# Patient Record
Sex: Male | Born: 1995 | Race: White | Hispanic: No | Marital: Single | Smoking: Current every day smoker
Health system: Southern US, Community
[De-identification: ages and names within clinical notes are randomized; demographics above are authoritative.]

## PROBLEM LIST (undated history)

## (undated) DIAGNOSIS — R748 Abnormal levels of other serum enzymes: Secondary | ICD-10-CM

## (undated) HISTORY — PX: LUMBAR DISC SURGERY: SHX700

---
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
## 2019-12-25 ENCOUNTER — Emergency Department (HOSPITAL_COMMUNITY): Payer: Self-pay

## 2019-12-25 ENCOUNTER — Encounter (HOSPITAL_COMMUNITY): Payer: Self-pay

## 2019-12-25 ENCOUNTER — Other Ambulatory Visit: Payer: Self-pay

## 2019-12-25 ENCOUNTER — Emergency Department (HOSPITAL_COMMUNITY)
Admission: EM | Admit: 2019-12-25 | Discharge: 2019-12-25 | Disposition: A | Discharge status: Home / Self Care | Payer: Self-pay | Attending: Emergency Medicine | Admitting: Emergency Medicine

## 2019-12-25 DIAGNOSIS — Y929 Unspecified place or not applicable: Secondary | ICD-10-CM | POA: Insufficient documentation

## 2019-12-25 DIAGNOSIS — W25XXXA Contact with sharp glass, initial encounter: Secondary | ICD-10-CM | POA: Insufficient documentation

## 2019-12-25 DIAGNOSIS — Y999 Unspecified external cause status: Secondary | ICD-10-CM | POA: Insufficient documentation

## 2019-12-25 DIAGNOSIS — Z23 Encounter for immunization: Secondary | ICD-10-CM | POA: Insufficient documentation

## 2019-12-25 DIAGNOSIS — Y939 Activity, unspecified: Secondary | ICD-10-CM | POA: Insufficient documentation

## 2019-12-25 DIAGNOSIS — S61412A Laceration without foreign body of left hand, initial encounter: Secondary | ICD-10-CM | POA: Insufficient documentation

## 2019-12-25 MED ORDER — LIDOCAINE-EPINEPHRINE 2 %-1:100000 IJ SOLN
10.0000 mL | Freq: Once | INTRAMUSCULAR | Status: AC
  Administered 2019-12-25: 10 mL
  Filled 2019-12-25: qty 1

## 2019-12-25 MED ORDER — TETANUS-DIPHTH-ACELL PERTUSSIS 5-2.5-18.5 LF-MCG/0.5 IM SUSP
0.5000 mL | Freq: Once | INTRAMUSCULAR | Status: AC
  Administered 2019-12-25: 04:00:00 0.5 mL via INTRAMUSCULAR
  Filled 2019-12-25: qty 0.5
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
## 2019-12-25 NOTE — Discharge Instructions (Signed)

## 2022-03-13 ENCOUNTER — Encounter (HOSPITAL_BASED_OUTPATIENT_CLINIC_OR_DEPARTMENT_OTHER): Payer: Self-pay | Admitting: Emergency Medicine

## 2022-03-13 ENCOUNTER — Other Ambulatory Visit: Payer: Self-pay

## 2022-03-13 ENCOUNTER — Emergency Department (HOSPITAL_BASED_OUTPATIENT_CLINIC_OR_DEPARTMENT_OTHER)
Admission: EM | Admit: 2022-03-13 | Discharge: 2022-03-13 | Disposition: A | Discharge status: Home / Self Care | Payer: BC Managed Care – PPO | Attending: Emergency Medicine | Admitting: Emergency Medicine

## 2022-03-13 ENCOUNTER — Emergency Department (HOSPITAL_BASED_OUTPATIENT_CLINIC_OR_DEPARTMENT_OTHER): Payer: BC Managed Care – PPO

## 2022-03-13 DIAGNOSIS — R110 Nausea: Secondary | ICD-10-CM | POA: Diagnosis not present

## 2022-03-13 DIAGNOSIS — R109 Unspecified abdominal pain: Secondary | ICD-10-CM | POA: Diagnosis not present

## 2022-03-13 DIAGNOSIS — K920 Hematemesis: Secondary | ICD-10-CM | POA: Diagnosis not present

## 2022-03-13 DIAGNOSIS — Y906 Blood alcohol level of 120-199 mg/100 ml: Secondary | ICD-10-CM | POA: Insufficient documentation

## 2022-03-13 DIAGNOSIS — R1013 Epigastric pain: Secondary | ICD-10-CM | POA: Insufficient documentation

## 2022-03-13 DIAGNOSIS — F1020 Alcohol dependence, uncomplicated: Secondary | ICD-10-CM | POA: Insufficient documentation

## 2022-03-13 DIAGNOSIS — R790 Abnormal level of blood mineral: Secondary | ICD-10-CM | POA: Diagnosis not present

## 2022-03-13 DIAGNOSIS — F1090 Alcohol use, unspecified, uncomplicated: Secondary | ICD-10-CM

## 2022-03-13 DIAGNOSIS — R079 Chest pain, unspecified: Secondary | ICD-10-CM | POA: Diagnosis not present

## 2022-03-13 DIAGNOSIS — R251 Tremor, unspecified: Secondary | ICD-10-CM | POA: Diagnosis not present

## 2022-03-13 DIAGNOSIS — R112 Nausea with vomiting, unspecified: Secondary | ICD-10-CM | POA: Diagnosis not present

## 2022-03-13 LAB — CBC WITH DIFFERENTIAL/PLATELET
Abs Immature Granulocytes: 0.01 10*3/uL (ref 0.00–0.07)
Basophils Absolute: 0.2 10*3/uL — ABNORMAL HIGH (ref 0.0–0.1)
Basophils Relative: 2 %
Eosinophils Absolute: 0.1 10*3/uL (ref 0.0–0.5)
Eosinophils Relative: 2 %
HCT: 40.8 % (ref 39.0–52.0)
Hemoglobin: 14.3 g/dL (ref 13.0–17.0)
Immature Granulocytes: 0 %
Lymphocytes Relative: 35 %
Lymphs Abs: 3 10*3/uL (ref 0.7–4.0)
MCH: 35 pg — ABNORMAL HIGH (ref 26.0–34.0)
MCHC: 35 g/dL (ref 30.0–36.0)
MCV: 99.8 fL (ref 80.0–100.0)
Monocytes Absolute: 1.1 10*3/uL — ABNORMAL HIGH (ref 0.1–1.0)
Monocytes Relative: 13 %
Neutro Abs: 4.1 10*3/uL (ref 1.7–7.7)
Neutrophils Relative %: 48 %
Platelets: 179 10*3/uL (ref 150–400)
RBC: 4.09 MIL/uL — ABNORMAL LOW (ref 4.22–5.81)
RDW: 12.1 % (ref 11.5–15.5)
WBC: 8.6 10*3/uL (ref 4.0–10.5)
nRBC: 0 % (ref 0.0–0.2)

## 2022-03-13 LAB — HEPATIC FUNCTION PANEL
ALT: 220 U/L — ABNORMAL HIGH (ref 0–44)
AST: 407 U/L — ABNORMAL HIGH (ref 15–41)
Albumin: 4.1 g/dL (ref 3.5–5.0)
Alkaline Phosphatase: 107 U/L (ref 38–126)
Bilirubin, Direct: 0.5 mg/dL — ABNORMAL HIGH (ref 0.0–0.2)
Indirect Bilirubin: 1.4 mg/dL — ABNORMAL HIGH (ref 0.3–0.9)
Total Bilirubin: 1.9 mg/dL — ABNORMAL HIGH (ref 0.3–1.2)
Total Protein: 7.2 g/dL (ref 6.5–8.1)

## 2022-03-13 LAB — PROTIME-INR
INR: 1 (ref 0.8–1.2)
Prothrombin Time: 13.4 seconds (ref 11.4–15.2)

## 2022-03-13 LAB — BASIC METABOLIC PANEL
Anion gap: 16 — ABNORMAL HIGH (ref 5–15)
BUN: 6 mg/dL (ref 6–20)
CO2: 23 mmol/L (ref 22–32)
Calcium: 9.4 mg/dL (ref 8.9–10.3)
Chloride: 97 mmol/L — ABNORMAL LOW (ref 98–111)
Creatinine, Ser: 0.72 mg/dL (ref 0.61–1.24)
GFR, Estimated: >60 mL/min (ref >60)
Glucose, Bld: 99 mg/dL (ref 70–99)
Potassium: 3.6 mmol/L (ref 3.5–5.1)
Sodium: 136 mmol/L (ref 135–145)

## 2022-03-13 LAB — TROPONIN I (HIGH SENSITIVITY)
Troponin I (High Sensitivity): 3 ng/L (ref <18)
Troponin I (High Sensitivity): 2 ng/L (ref <18)

## 2022-03-13 LAB — ETHANOL: Alcohol, Ethyl (B): 148 mg/dL — ABNORMAL HIGH (ref <10)

## 2022-03-13 MED ORDER — METOCLOPRAMIDE HCL 5 MG/ML IJ SOLN
10.0000 mg | Freq: Once | INTRAMUSCULAR | Status: AC
  Administered 2022-03-13: 10 mg via INTRAVENOUS
  Filled 2022-03-13: qty 2

## 2022-03-13 MED ORDER — FAMOTIDINE 20 MG PO TABS: 40.0000 mg | ORAL_TABLET | Freq: Every day | ORAL | 0 refills | Status: DC

## 2022-03-13 MED ORDER — DIPHENHYDRAMINE HCL 25 MG PO CAPS
25.0000 mg | ORAL_CAPSULE | Freq: Once | ORAL | Status: AC
  Administered 2022-03-13: 25 mg via ORAL
  Filled 2022-03-13: qty 1

## 2022-03-13 MED ORDER — PANTOPRAZOLE SODIUM 40 MG IV SOLR
40.0000 mg | Freq: Once | INTRAVENOUS | Status: AC
  Administered 2022-03-13: 40 mg via INTRAVENOUS
  Filled 2022-03-13: qty 10

## 2022-03-13 MED ORDER — VITAMIN B-12 100 MCG PO TABS: 100.0000 ug | ORAL_TABLET | Freq: Every day | ORAL | 0 refills | Status: DC

## 2022-03-13 MED ORDER — LIDOCAINE VISCOUS HCL 2 % MT SOLN
15.0000 mL | Freq: Once | OROMUCOSAL | Status: AC
  Administered 2022-03-13: 15 mL via ORAL
  Filled 2022-03-13: qty 15

## 2022-03-13 MED ORDER — ALUM & MAG HYDROXIDE-SIMETH 200-200-20 MG/5ML PO SUSP
30.0000 mL | Freq: Once | ORAL | Status: AC
  Administered 2022-03-13: 30 mL via ORAL
  Filled 2022-03-13: qty 30

## 2022-03-13 MED ORDER — ONDANSETRON 4 MG PO TBDP: 4.0000 mg | ORAL_TABLET | Freq: Three times a day (TID) | ORAL | 0 refills | Status: DC | PRN

## 2022-03-13 MED ORDER — LORAZEPAM 2 MG/ML IJ SOLN
1.0000 mg | Freq: Once | INTRAMUSCULAR | Status: AC
  Administered 2022-03-13: 1 mg via INTRAVENOUS
  Filled 2022-03-13: qty 1

## 2022-03-13 MED ORDER — FOLIC ACID 1 MG PO TABS: 1.0000 mg | ORAL_TABLET | Freq: Every day | ORAL | 0 refills | Status: DC

## 2022-03-13 NOTE — ED Provider Notes (Signed)
?[facility] EMERGENCY DEPARTMENT ?Provider Note ? ? ?CSN: [id number] ?Arrival date & time: 03/13/22  1554 ? ?  ? ?History ? ?Chief Complaint  ?Patient presents with  ? Hematemesis  ? Abdominal Pain  ? ? ?Thomas Wyatt is a 26 y.o. male. ? ? ?Chest Pain ? ?Patient is a 26 year old male presented emergency room today with complaints of ?Yesterday around he states he has had some epigastric abdominal pain and states that after vomiting numerous times he has had some blood in his vomit.  He states that it is bright red blood.  Denies any dark or tarry looking poop.  He states he is always somewhat tremulous but feels that he is more shaky today.  He does not have any difficulty breathing denies any hemoptysis no lightheadedness or dizziness.  Denies any bowel or bladder incontinence or back pain. ? ?He states he is able to walk without difficulty. ? ? ?Patient drinks vodka daily he states he sips vodka.  Seems that he is drinking Smirnov ice but is drinking large quantities of it.  Is a very difficult time describing to me what kind of bottle he is drinking is out of but it seems to be one of the taller bowels not simply a 12 ounce bottle. ? ?He states his last drink of alcohol was earlier today.  No other associate symptoms. ?  ? ?Home Medications ?Prior to Admission medications   ?Medication Sig Start Date End Date Taking? Authorizing Provider  ?famotidine (PEPCID) 20 MG tablet Take 2 tablets (40 mg total) by mouth daily. 03/13/22  Yes Gailen Shelter, PA  ?ondansetron (ZOFRAN-ODT) 4 MG disintegrating tablet Take 1 tablet (4 mg total) by mouth every 8 (eight) hours as needed for nausea or vomiting. 03/13/22  Yes Gailen Shelter, PA  ?   ? ?Allergies    ?Patient has no known allergies.   ? ?Review of Systems   ?Review of Systems  ?Cardiovascular:  Positive for chest pain.  ? ?Physical Exam ?Updated Vital Signs ?BP 127/90   Pulse 89   Temp 99 ?F (37.2 ?C)   Resp 19   Ht 5\' 7"  (1.702 m)   Wt 68 kg   SpO2  97%   BMI 23.49 kg/m?  ?Physical Exam ?Vitals and nursing note reviewed.  ?Constitutional:   ?   General: He is not in acute distress. ?   Comments: Chronically ill-appearing 26 year old male.  Somewhat tremulous  ?HENT:  ?   Head: Normocephalic and atraumatic.  ?   Nose: Nose normal.  ?   Mouth/Throat:  ?   Mouth: Mucous membranes are moist.  ?Eyes:  ?   General: No scleral icterus. ?Cardiovascular:  ?   Rate and Rhythm: Normal rate and regular rhythm.  ?   Pulses: Normal pulses.  ?   Heart sounds: Normal heart sounds.  ?Pulmonary:  ?   Effort: Pulmonary effort is normal. No respiratory distress.  ?   Breath sounds: Normal breath sounds. No wheezing.  ?Abdominal:  ?   Palpations: Abdomen is soft.  ?   Tenderness: There is no abdominal tenderness. There is no guarding or rebound.  ?Musculoskeletal:  ?   Cervical back: Normal range of motion.  ?   Right lower leg: No edema.  ?   Left lower leg: No edema.  ?Skin: ?   General: Skin is warm and dry.  ?   Capillary Refill: Capillary refill takes less than 2 seconds.  ?Neurological:  ?  Mental Status: He is alert. Mental status is at baseline.  ?Psychiatric:     ?   Mood and Affect: Mood normal.     ?   Behavior: Behavior normal.  ? ? ?ED Results / Procedures / Treatments   ?Labs ?(all labs ordered are listed, but only abnormal results are displayed) ?Labs Reviewed  ?BASIC METABOLIC PANEL - Abnormal; Notable for the following components:  ?    Result Value  ? Chloride 97 (*)   ? Anion gap 16 (*)   ? All other components within normal limits  ?CBC WITH DIFFERENTIAL/PLATELET - Abnormal; Notable for the following components:  ? RBC 4.09 (*)   ? MCH 35.0 (*)   ? Monocytes Absolute 1.1 (*)   ? Basophils Absolute 0.2 (*)   ? All other components within normal limits  ?ETHANOL - Abnormal; Notable for the following components:  ? Alcohol, Ethyl (B) 148 (*)   ? All other components within normal limits  ?HEPATIC FUNCTION PANEL - Abnormal; Notable for the following components:  ?  AST 407 (*)   ? ALT 220 (*)   ? Total Bilirubin 1.9 (*)   ? Bilirubin, Direct 0.5 (*)   ? Indirect Bilirubin 1.4 (*)   ? All other components within normal limits  ?PROTIME-INR  ?TROPONIN I (HIGH SENSITIVITY)  ?TROPONIN I (HIGH SENSITIVITY)  ? ? ?EKG ?None ? ?Radiology ?DG Chest 2 View ? ?Result Date: 03/13/2022 ?CLINICAL DATA:  Left-sided chest and abdominal pain beginning yesterday. Hematemesis. EXAM: CHEST - 2 VIEW COMPARISON:  None Available. FINDINGS: The heart size and mediastinal contours are within normal limits. Both lungs are clear. The visualized skeletal structures are unremarkable. IMPRESSION: No active cardiopulmonary disease. Electronically Signed   By: Danae Orleans M.D.   On: 03/13/2022 16:35   ? ?Procedures ?Procedures  ? ? ?Medications Ordered in ED ?Medications  ?LORazepam (ATIVAN) injection 1 mg (1 mg Intravenous Given 03/13/22 1819)  ?metoCLOPramide (REGLAN) injection 10 mg (10 mg Intravenous Given 03/13/22 1821)  ?diphenhydrAMINE (BENADRYL) capsule 25 mg (25 mg Oral Given 03/13/22 1810)  ?pantoprazole (PROTONIX) injection 40 mg (40 mg Intravenous Given 03/13/22 1828)  ?alum & mag hydroxide-simeth (MAALOX/MYLANTA) 200-200-20 MG/5ML suspension 30 mL (30 mLs Oral Given 03/13/22 1917)  ?  And  ?lidocaine (XYLOCAINE) 2 % viscous mouth solution 15 mL (15 mLs Oral Given 03/13/22 1917)  ? ? ?ED Course/ Medical Decision Making/ A&P ?  ?                        ?Medical Decision Making ?Amount and/or Complexity of Data Reviewed ?Labs: ordered. ?Radiology: ordered. ? ?Risk ?OTC drugs. ?Prescription drug management. ? ? ?Patient is a 26 year old male presented emergency room today with complaints of ?Yesterday around he states he has had some epigastric abdominal pain and states that after vomiting numerous times he has had some blood in his vomit.  He states that it is bright red blood.  Denies any dark or tarry looking poop.  He states he is always somewhat tremulous but feels that he is more shaky today.  He  does not have any difficulty breathing denies any hemoptysis no lightheadedness or dizziness.  Denies any bowel or bladder incontinence or back pain. ? ?He states he is able to walk without difficulty. ? ? ?Patient drinks vodka daily he states he sips vodka.  Seems that he is drinking Smirnov ice but is drinking large quantities of it.  Is a very  difficult time describing to me what kind of bottle he is drinking is out of but it seems to be one of the taller bowels not simply a 12 ounce bottle. ? ?He states his last drink of alcohol was earlier today.  No other associate symptoms. ? ? ? ?Patient is a heavy alcohol drinker.  His ethanol is elevated 148 today.  Transaminitis consistent with alcohol use.  BNP mild elevation anion gap likely alcoholic ketosis.  CBC unremarkable.  Platelets within normal limits INR within normal limits troponin x2 flat not remarkable.  EKG without evidence of acute ischemia.  Chest x-ray unremarkable I personally viewed all labs images and EKGs. ? ?After GI cocktail, Reglan Benadryl and 1 mg of Ativan and 1 dose of Protonix patient is asymptomatic.  Tolerating p.o. ? ?He is quite drowsy after the Ativan. ? ?He was monitored for another additional hour p.o. challenged and will be discharged home.  Recommend alcohol cessation he states that he will "taper down "resources provided ? ?Final Clinical Impression(s) / ED Diagnoses ?Final diagnoses:  ?Epigastric abdominal pain  ?Hematemesis with nausea  ?Alcohol use disorder  ? ? ?Rx / DC Orders ?ED Discharge Orders   ? ?      Ordered  ?  famotidine (PEPCID) 20 MG tablet  Daily       ? 03/13/22 2003  ?  ondansetron (ZOFRAN-ODT) 4 MG disintegrating tablet  Every 8 hours PRN       ? 03/13/22 2004  ? ?  ?  ? ?  ? ? ?  ?Gailen ShelterFondaw, Aela Bohan S, [place]PA ?03/13/22 2011 ? ?  ?Vanetta MuldersZackowski, Scott, MD ?03/14/22 1659 ? ?

## 2022-03-13 NOTE — ED Triage Notes (Addendum)
Hematemesis last night. Cp and left sided abdominal pain x 2 - 3 hours. Sitting up straight worsens pain. Denies shob.  ?

## 2022-03-13 NOTE — ED Notes (Signed)
Pt states he began having nausea and vomiting blood at 1600 yesterday.  CP started at approx 1300 today.  Pt states he has had 2 episodes of vomiting blood in 24 hrs.   ?Pt is cold and trembling.  IV started and meds given. Girlfriend at bedside ?

## 2022-03-13 NOTE — Discharge Instructions (Addendum)
I recommend that you cut down dramatically on your alcohol use.  As we discussed, if you were willing to stop using alcohol I would be willing to prescribe you medicine to help you with this ? ?I have given you the information for outpatient counseling and therapy as well as substance use clinics ? ?You may always return the emergency room for any new or concerning symptoms.  Use Pepcid every day as prescribed.  I also prescribed you Zofran which is a nausea medicine you can use as needed for nausea. ?

## 2023-03-08 ENCOUNTER — Other Ambulatory Visit: Payer: Self-pay

## 2023-03-08 ENCOUNTER — Encounter (HOSPITAL_BASED_OUTPATIENT_CLINIC_OR_DEPARTMENT_OTHER): Payer: Self-pay

## 2023-03-08 ENCOUNTER — Emergency Department (HOSPITAL_BASED_OUTPATIENT_CLINIC_OR_DEPARTMENT_OTHER): Payer: BC Managed Care – PPO

## 2023-03-08 ENCOUNTER — Emergency Department (HOSPITAL_BASED_OUTPATIENT_CLINIC_OR_DEPARTMENT_OTHER)
Admission: EM | Admit: 2023-03-08 | Discharge: 2023-03-08 | Disposition: A | Discharge status: Home / Self Care | Payer: BC Managed Care – PPO | Attending: Emergency Medicine | Admitting: Emergency Medicine

## 2023-03-08 DIAGNOSIS — M79605 Pain in left leg: Secondary | ICD-10-CM | POA: Diagnosis not present

## 2023-03-08 DIAGNOSIS — M5442 Lumbago with sciatica, left side: Secondary | ICD-10-CM | POA: Insufficient documentation

## 2023-03-08 DIAGNOSIS — M549 Dorsalgia, unspecified: Secondary | ICD-10-CM | POA: Diagnosis not present

## 2023-03-08 MED ORDER — KETOROLAC TROMETHAMINE 30 MG/ML IJ SOLN
30.0000 mg | Freq: Once | INTRAMUSCULAR | Status: AC
  Administered 2023-03-08: 30 mg via INTRAMUSCULAR
  Filled 2023-03-08: qty 1

## 2023-03-08 MED ORDER — CYCLOBENZAPRINE HCL 5 MG PO TABS
5.0000 mg | ORAL_TABLET | Freq: Once | ORAL | Status: AC
  Administered 2023-03-08: 5 mg via ORAL
  Filled 2023-03-08: qty 1

## 2023-03-08 MED ORDER — NAPROXEN 500 MG PO TABS: 500.0000 mg | ORAL_TABLET | Freq: Two times a day (BID) | ORAL | 0 refills | Status: DC

## 2023-03-08 MED ORDER — LIDOCAINE 5 % EX PTCH
1.0000 | MEDICATED_PATCH | CUTANEOUS | Status: DC
  Administered 2023-03-08: 1 via TRANSDERMAL
  Filled 2023-03-08: qty 1

## 2023-03-08 MED ORDER — CYCLOBENZAPRINE HCL 5 MG PO TABS: 5.0000 mg | ORAL_TABLET | Freq: Two times a day (BID) | ORAL | 0 refills | Status: DC | PRN
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
## 2023-03-08 NOTE — Discharge Instructions (Addendum)
Please follow-up with your primary care doctor, return to the ER if you have any changes in the color of your leg, weakness, numbness.  I have provided you with exercises, to help with your sciatica.  Do not take the naproxen with other anti-inflammatories.  Be careful with the Flexeril as this may cause increased fatigue.

## 2023-03-08 NOTE — ED Triage Notes (Addendum)
L back, hip, and leg pain x 1 year but getting worse x2 weeks. No injury reported.

## 2023-03-12 DIAGNOSIS — M5442 Lumbago with sciatica, left side: Secondary | ICD-10-CM | POA: Diagnosis not present

## 2023-03-19 ENCOUNTER — Other Ambulatory Visit (HOSPITAL_COMMUNITY): Payer: Self-pay | Admitting: Family Medicine

## 2023-03-19 DIAGNOSIS — M5442 Lumbago with sciatica, left side: Secondary | ICD-10-CM

## 2023-03-19 DIAGNOSIS — R300 Dysuria: Secondary | ICD-10-CM | POA: Diagnosis not present

## 2023-03-20 ENCOUNTER — Ambulatory Visit (HOSPITAL_COMMUNITY)
Admission: RE | Admit: 2023-03-20 | Discharge: 2023-03-20 | Disposition: A | Discharge status: Home / Self Care | Payer: BC Managed Care – PPO | Source: Ambulatory Visit | Attending: Family Medicine | Admitting: Family Medicine

## 2023-03-20 DIAGNOSIS — M5442 Lumbago with sciatica, left side: Secondary | ICD-10-CM | POA: Insufficient documentation

## 2023-03-20 MED ORDER — GADOBUTROL 1 MMOL/ML IV SOLN
6.0000 mL | Freq: Once | INTRAVENOUS | Status: AC | PRN
  Administered 2023-03-20: 6 mL via INTRAVENOUS

## 2023-03-28 DIAGNOSIS — M5126 Other intervertebral disc displacement, lumbar region: Secondary | ICD-10-CM | POA: Diagnosis not present

## 2023-04-17 DIAGNOSIS — R339 Retention of urine, unspecified: Secondary | ICD-10-CM | POA: Diagnosis not present

## 2023-04-17 DIAGNOSIS — M5127 Other intervertebral disc displacement, lumbosacral region: Secondary | ICD-10-CM | POA: Diagnosis not present

## 2023-04-17 DIAGNOSIS — R300 Dysuria: Secondary | ICD-10-CM | POA: Diagnosis not present

## 2023-04-18 DIAGNOSIS — R300 Dysuria: Secondary | ICD-10-CM | POA: Diagnosis not present

## 2023-04-26 DIAGNOSIS — M5126 Other intervertebral disc displacement, lumbar region: Secondary | ICD-10-CM | POA: Diagnosis not present

## 2023-05-01 DIAGNOSIS — M5126 Other intervertebral disc displacement, lumbar region: Secondary | ICD-10-CM | POA: Diagnosis not present

## 2023-05-07 DIAGNOSIS — M5416 Radiculopathy, lumbar region: Secondary | ICD-10-CM | POA: Diagnosis not present

## 2023-05-08 DIAGNOSIS — M5126 Other intervertebral disc displacement, lumbar region: Secondary | ICD-10-CM | POA: Diagnosis not present

## 2023-05-21 DIAGNOSIS — M5126 Other intervertebral disc displacement, lumbar region: Secondary | ICD-10-CM | POA: Diagnosis not present

## 2023-05-21 IMAGING — DX DG CHEST 2V
2 series · 2 of 2 positions shown · non-contrast
Comparison: None Available.

CLINICAL DATA: Left-sided chest and abdominal pain beginning
yesterday. Hematemesis.

EXAM:
CHEST - 2 VIEW

[chest pa]
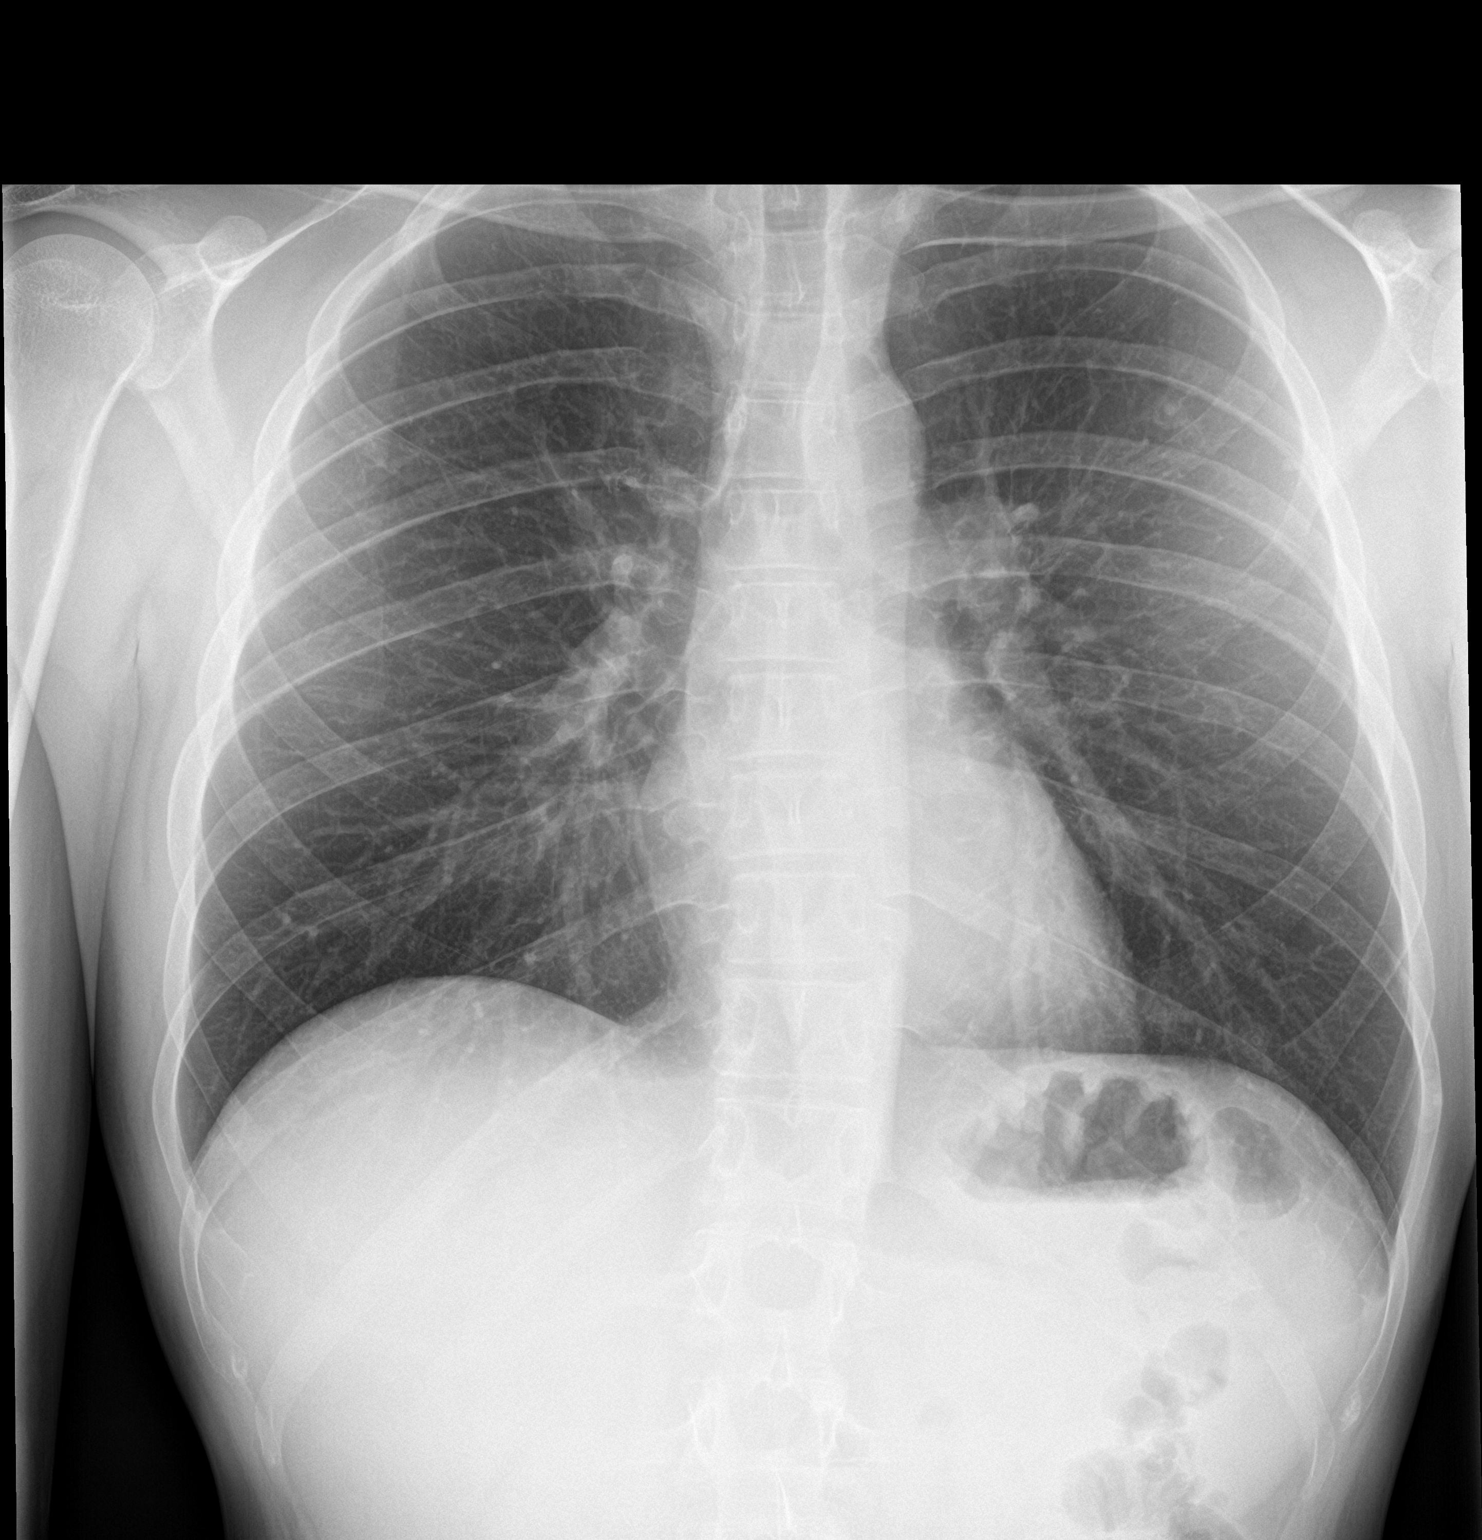

[chest lat]
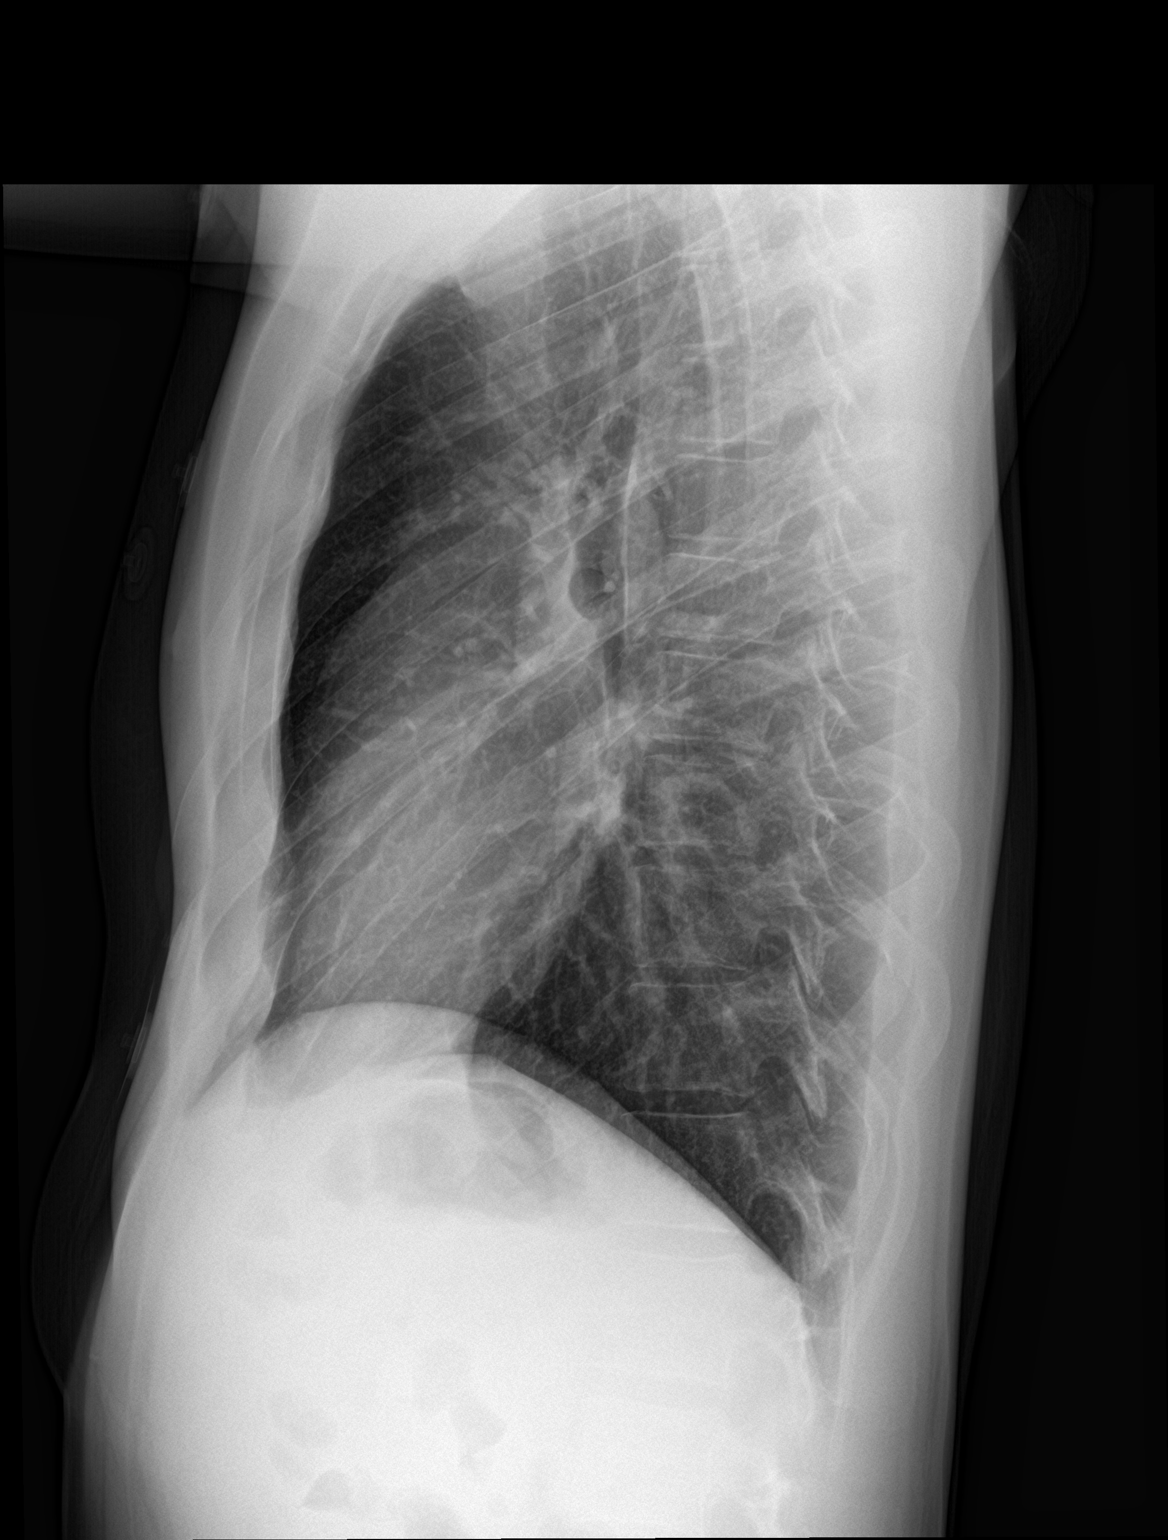

[2 of 2 positions shown; findings below may reference images not displayed]

FINDINGS: The heart size and mediastinal contours are within normal limits.
Both lungs are clear. The visualized skeletal structures are
unremarkable.
IMPRESSION: No active cardiopulmonary disease.

## 2023-05-23 DIAGNOSIS — M5126 Other intervertebral disc displacement, lumbar region: Secondary | ICD-10-CM | POA: Diagnosis not present

## 2023-06-03 DIAGNOSIS — M5127 Other intervertebral disc displacement, lumbosacral region: Secondary | ICD-10-CM | POA: Diagnosis not present

## 2023-06-03 DIAGNOSIS — M5126 Other intervertebral disc displacement, lumbar region: Secondary | ICD-10-CM | POA: Diagnosis not present

## 2023-06-03 DIAGNOSIS — M5117 Intervertebral disc disorders with radiculopathy, lumbosacral region: Secondary | ICD-10-CM | POA: Diagnosis not present

## 2023-08-29 DIAGNOSIS — R17 Unspecified jaundice: Secondary | ICD-10-CM | POA: Diagnosis not present

## 2023-09-02 DIAGNOSIS — R748 Abnormal levels of other serum enzymes: Secondary | ICD-10-CM | POA: Diagnosis not present

## 2023-09-03 ENCOUNTER — Other Ambulatory Visit (HOSPITAL_BASED_OUTPATIENT_CLINIC_OR_DEPARTMENT_OTHER): Payer: Self-pay | Admitting: Family Medicine

## 2023-09-03 ENCOUNTER — Encounter (HOSPITAL_BASED_OUTPATIENT_CLINIC_OR_DEPARTMENT_OTHER): Payer: Self-pay | Admitting: Family Medicine

## 2023-09-03 DIAGNOSIS — R7989 Other specified abnormal findings of blood chemistry: Secondary | ICD-10-CM

## 2023-09-09 ENCOUNTER — Emergency Department (HOSPITAL_COMMUNITY): Payer: BC Managed Care – PPO

## 2023-09-09 ENCOUNTER — Inpatient Hospital Stay (HOSPITAL_COMMUNITY)
Admission: EM | Admit: 2023-09-09 | Discharge: 2023-09-13 | DRG: 433 | Disposition: A | Discharge status: Home / Self Care | Payer: BC Managed Care – PPO | Attending: Student | Admitting: Student

## 2023-09-09 DIAGNOSIS — K5730 Diverticulosis of large intestine without perforation or abscess without bleeding: Secondary | ICD-10-CM | POA: Diagnosis present

## 2023-09-09 DIAGNOSIS — E871 Hypo-osmolality and hyponatremia: Secondary | ICD-10-CM | POA: Diagnosis present

## 2023-09-09 DIAGNOSIS — K7010 Alcoholic hepatitis without ascites: Principal | ICD-10-CM | POA: Diagnosis present

## 2023-09-09 DIAGNOSIS — I8510 Secondary esophageal varices without bleeding: Secondary | ICD-10-CM | POA: Diagnosis present

## 2023-09-09 DIAGNOSIS — F1021 Alcohol dependence, in remission: Secondary | ICD-10-CM

## 2023-09-09 DIAGNOSIS — E876 Hypokalemia: Secondary | ICD-10-CM | POA: Diagnosis present

## 2023-09-09 DIAGNOSIS — R17 Unspecified jaundice: Secondary | ICD-10-CM | POA: Diagnosis not present

## 2023-09-09 DIAGNOSIS — K219 Gastro-esophageal reflux disease without esophagitis: Secondary | ICD-10-CM | POA: Diagnosis present

## 2023-09-09 DIAGNOSIS — R162 Hepatomegaly with splenomegaly, not elsewhere classified: Secondary | ICD-10-CM | POA: Diagnosis not present

## 2023-09-09 DIAGNOSIS — R932 Abnormal findings on diagnostic imaging of liver and biliary tract: Secondary | ICD-10-CM | POA: Diagnosis not present

## 2023-09-09 DIAGNOSIS — K7030 Alcoholic cirrhosis of liver without ascites: Secondary | ICD-10-CM | POA: Diagnosis present

## 2023-09-09 DIAGNOSIS — K7469 Other cirrhosis of liver: Secondary | ICD-10-CM | POA: Diagnosis not present

## 2023-09-09 DIAGNOSIS — K766 Portal hypertension: Secondary | ICD-10-CM | POA: Diagnosis not present

## 2023-09-09 DIAGNOSIS — I8500 Esophageal varices without bleeding: Secondary | ICD-10-CM | POA: Insufficient documentation

## 2023-09-09 DIAGNOSIS — F17200 Nicotine dependence, unspecified, uncomplicated: Secondary | ICD-10-CM | POA: Insufficient documentation

## 2023-09-09 DIAGNOSIS — R7401 Elevation of levels of liver transaminase levels: Principal | ICD-10-CM

## 2023-09-09 DIAGNOSIS — R9431 Abnormal electrocardiogram [ECG] [EKG]: Secondary | ICD-10-CM | POA: Diagnosis not present

## 2023-09-09 DIAGNOSIS — Z91410 Personal history of adult physical and sexual abuse: Secondary | ICD-10-CM

## 2023-09-09 DIAGNOSIS — D689 Coagulation defect, unspecified: Secondary | ICD-10-CM | POA: Diagnosis present

## 2023-09-09 DIAGNOSIS — E875 Hyperkalemia: Secondary | ICD-10-CM | POA: Diagnosis present

## 2023-09-09 DIAGNOSIS — K760 Fatty (change of) liver, not elsewhere classified: Secondary | ICD-10-CM | POA: Diagnosis present

## 2023-09-09 DIAGNOSIS — D72829 Elevated white blood cell count, unspecified: Secondary | ICD-10-CM | POA: Diagnosis present

## 2023-09-09 DIAGNOSIS — F17210 Nicotine dependence, cigarettes, uncomplicated: Secondary | ICD-10-CM | POA: Diagnosis present

## 2023-09-09 DIAGNOSIS — R531 Weakness: Secondary | ICD-10-CM | POA: Diagnosis not present

## 2023-09-09 DIAGNOSIS — F1010 Alcohol abuse, uncomplicated: Secondary | ICD-10-CM | POA: Diagnosis present

## 2023-09-09 DIAGNOSIS — R748 Abnormal levels of other serum enzymes: Secondary | ICD-10-CM | POA: Diagnosis not present

## 2023-09-09 LAB — CBC WITH DIFFERENTIAL/PLATELET
Abs Immature Granulocytes: 0.26 10*3/uL — ABNORMAL HIGH (ref 0.00–0.07)
Basophils Absolute: 0.1 10*3/uL (ref 0.0–0.1)
Basophils Relative: 1 %
Eosinophils Absolute: 0.2 10*3/uL (ref 0.0–0.5)
Eosinophils Relative: 1 %
HCT: 36.6 % — ABNORMAL LOW (ref 39.0–52.0)
Hemoglobin: 13.3 g/dL (ref 13.0–17.0)
Immature Granulocytes: 2 %
Lymphocytes Relative: 14 %
Lymphs Abs: 1.9 10*3/uL (ref 0.7–4.0)
MCH: 36.2 pg — ABNORMAL HIGH (ref 26.0–34.0)
MCHC: 36.3 g/dL — ABNORMAL HIGH (ref 30.0–36.0)
MCV: 99.7 fL (ref 80.0–100.0)
Monocytes Absolute: 1.5 10*3/uL — ABNORMAL HIGH (ref 0.1–1.0)
Monocytes Relative: 11 %
Neutro Abs: 10.2 10*3/uL — ABNORMAL HIGH (ref 1.7–7.7)
Neutrophils Relative %: 71 %
Platelets: 245 10*3/uL (ref 150–400)
RBC: 3.67 MIL/uL — ABNORMAL LOW (ref 4.22–5.81)
RDW: 21.5 % — ABNORMAL HIGH (ref 11.5–15.5)
WBC: 14.2 10*3/uL — ABNORMAL HIGH (ref 4.0–10.5)
nRBC: 0 % (ref 0.0–0.2)

## 2023-09-09 LAB — PROTIME-INR
INR: 1.3 — ABNORMAL HIGH (ref 0.8–1.2)
Prothrombin Time: 16 s — ABNORMAL HIGH (ref 11.4–15.2)

## 2023-09-09 LAB — COMPREHENSIVE METABOLIC PANEL
ALT: 75 U/L — ABNORMAL HIGH (ref 0–44)
AST: 157 U/L — ABNORMAL HIGH (ref 15–41)
Albumin: 2.6 g/dL — ABNORMAL LOW (ref 3.5–5.0)
Alkaline Phosphatase: 187 U/L — ABNORMAL HIGH (ref 38–126)
Anion gap: 13 (ref 5–15)
BUN: <5 mg/dL — ABNORMAL LOW (ref 6–20)
CO2: 25 mmol/L (ref 22–32)
Calcium: 8.4 mg/dL — ABNORMAL LOW (ref 8.9–10.3)
Chloride: 92 mmol/L — ABNORMAL LOW (ref 98–111)
Creatinine, Ser: 0.49 mg/dL — ABNORMAL LOW (ref 0.61–1.24)
GFR, Estimated: >60 mL/min (ref >60)
Glucose, Bld: 109 mg/dL — ABNORMAL HIGH (ref 70–99)
Potassium: 2.4 mmol/L — CL (ref 3.5–5.1)
Sodium: 130 mmol/L — ABNORMAL LOW (ref 135–145)
Total Bilirubin: 29.7 mg/dL (ref <1.2)
Total Protein: 7.1 g/dL (ref 6.5–8.1)

## 2023-09-09 LAB — HEPATITIS PANEL, ACUTE
HCV Ab: NONREACTIVE
Hep A IgM: NONREACTIVE
Hep B C IgM: NONREACTIVE
Hepatitis B Surface Ag: NONREACTIVE

## 2023-09-09 LAB — MAGNESIUM: Magnesium: 2.2 mg/dL (ref 1.7–2.4)

## 2023-09-09 LAB — AMMONIA: Ammonia: 64 umol/L — ABNORMAL HIGH (ref 9–35)

## 2023-09-09 LAB — LIPASE, BLOOD: Lipase: 72 U/L — ABNORMAL HIGH (ref 11–51)

## 2023-09-09 MED ORDER — IOHEXOL 300 MG/ML  SOLN
100.0000 mL | Freq: Once | INTRAMUSCULAR | Status: AC | PRN
  Administered 2023-09-09: 100 mL via INTRAVENOUS

## 2023-09-09 MED ORDER — POTASSIUM CHLORIDE 10 MEQ/100ML IV SOLN
10.0000 meq | INTRAVENOUS | Status: AC
  Administered 2023-09-09 (×2): 10 meq via INTRAVENOUS
  Filled 2023-09-09 (×2): qty 100

## 2023-09-09 NOTE — ED Provider Notes (Signed)
[place] EMERGENCY DEPARTMENT AT [facility] Provider Note   CSN: [id number] Arrival date & time: 09/09/23  1801     History  No chief complaint on file.   Thomas Wyatt is a 27 y.o. male with a history of heavy alcohol consumption presenting to ED with worsening jaundice for the past 10 days.  Patient ports yellowing of the skin ongoing for about a week, prior to that having 5 days of persistent nausea and vomiting.  He was seen for a routine follow-up visit by his neurosurgeon, performed a hernia surgery in the past, and they were concerned about his level of jaundice.  Referred to PCP for outpatient testing, where he was reportedly told his bilirubin level was 26.2, and his potassium was 6.2.  Patient denies ongoing nausea, vomiting, abdominal pain, bloating.  Reports regular daily bowel movements.  Reports dark urine in the morning but clears throughout the day.  The patient says he was a heavy alcohol drinker but quit about 10 days ago.  He had been drinking about 6-8 drinks or shots a day.  He quickly weaned himself down to a single shot and then stopped altogether 3 days ago, denies any tremors or withdrawal symptoms.  He denies any travel outside the country.  HPI     Home Medications Prior to Admission medications   Medication Sig Start Date End Date Taking? Authorizing Provider  cyclobenzaprine (FLEXERIL) 5 MG tablet Take 1 tablet (5 mg total) by mouth 2 (two) times daily as needed for muscle spasms. 03/08/23   Small, Brooke L, PA  famotidine (PEPCID) 20 MG tablet Take 2 tablets (40 mg total) by mouth daily. 03/13/22   Gailen Shelter, PA  folic acid (FOLVITE) 1 MG tablet Take 1 tablet (1 mg total) by mouth daily. 03/13/22   Gailen Shelter, PA  naproxen (NAPROSYN) 500 MG tablet Take 1 tablet (500 mg total) by mouth 2 (two) times daily. 03/08/23   Small, Brooke L, PA  ondansetron (ZOFRAN-ODT) 4 MG disintegrating tablet Take 1 tablet (4 mg total) by mouth every 8  (eight) hours as needed for nausea or vomiting. 03/13/22   Gailen Shelter, PA  vitamin B-12 (CYANOCOBALAMIN) 100 MCG tablet Take 1 tablet (100 mcg total) by mouth daily. 03/13/22   Gailen Shelter, PA      Allergies    Patient has no known allergies.    Review of Systems   Review of Systems  Physical Exam Updated Vital Signs BP 118/77   Pulse (!) 101   Temp 98.6 F (37 C) (Oral)   Resp 15   Ht 5\' 8"  (1.727 m)   Wt 56.3 kg   SpO2 97%   BMI 18.88 kg/m  Physical Exam Constitutional:      General: He is not in acute distress. HENT:     Head: Normocephalic and atraumatic.  Eyes:     General: Scleral icterus present.     Pupils: Pupils are equal, round, and reactive to light.  Cardiovascular:     Rate and Rhythm: Normal rate and regular rhythm.  Pulmonary:     Effort: Pulmonary effort is normal. No respiratory distress.  Abdominal:     General: There is no distension.     Tenderness: There is no abdominal tenderness.  Skin:    General: Skin is warm and dry.     Coloration: Skin is jaundiced.     Comments: Jaundiced the entire body  Neurological:  General: No focal deficit present.     Mental Status: He is alert. Mental status is at baseline.  Psychiatric:        Mood and Affect: Mood normal.        Behavior: Behavior normal.     ED Results / Procedures / Treatments   Labs (all labs ordered are listed, but only abnormal results are displayed) Labs Reviewed  CBC WITH DIFFERENTIAL/PLATELET - Abnormal; Notable for the following components:      Result Value   WBC 14.2 (*)    RBC 3.67 (*)    HCT 36.6 (*)    MCH 36.2 (*)    MCHC 36.3 (*)    RDW 21.5 (*)    Neutro Abs 10.2 (*)    Monocytes Absolute 1.5 (*)    Abs Immature Granulocytes 0.26 (*)    All other components within normal limits  COMPREHENSIVE METABOLIC PANEL - Abnormal; Notable for the following components:   Sodium 130 (*)    Potassium 2.4 (*)    Chloride 92 (*)    Glucose, Bld 109 (*)    BUN  <5 (*)    Creatinine, Ser 0.49 (*)    Calcium 8.4 (*)    Albumin 2.6 (*)    AST 157 (*)    ALT 75 (*)    Alkaline Phosphatase 187 (*)    Total Bilirubin 29.7 (*)    All other components within normal limits  LIPASE, BLOOD - Abnormal; Notable for the following components:   Lipase 72 (*)    All other components within normal limits  PROTIME-INR - Abnormal; Notable for the following components:   Prothrombin Time 16.0 (*)    INR 1.3 (*)    All other components within normal limits  AMMONIA - Abnormal; Notable for the following components:   Ammonia 64 (*)    All other components within normal limits  HEPATITIS PANEL, ACUTE  MAGNESIUM  ACETAMINOPHEN LEVEL    EKG EKG Interpretation Date/Time:  Monday September 09 2023 20:43:48 EST Ventricular Rate:  97 PR Interval:  135 QRS Duration:  100 QT Interval:  410 QTC Calculation: 521 R Axis:   160  Text Interpretation: Sinus rhythm Probable left atrial enlargement Right axis deviation Low voltage, precordial leads Borderline T wave abnormalities Prolonged QT interval  Baseline wander leads V3-V5  Confirmed by Alvester Chou [phone]) on 09/09/2023 8:46:32 PM  Radiology No results found.  Procedures .Critical Care  Performed by: Terald Sleeper, MD Authorized by: Terald Sleeper, MD   Critical care provider statement:    Critical care time (minutes):  30   Critical care time was exclusive of:  Separately billable procedures and treating other patients   Critical care was necessary to treat or prevent imminent or life-threatening deterioration of the following conditions:  Metabolic crisis   Critical care was time spent personally by me on the following activities:  Ordering and performing treatments and interventions, ordering and review of laboratory studies, ordering and review of radiographic studies, pulse oximetry, review of old charts, examination of patient and evaluation of patient's response to treatment   Care  discussed with: admitting provider   Comments:     Hypokalemia IV potassium repletion     Medications Ordered in ED Medications  potassium chloride 10 mEq in 100 mL IVPB (10 mEq Intravenous New Bag/Given 09/09/23 2310)  iohexol (OMNIPAQUE) 300 MG/ML solution 100 mL (100 mLs Intravenous Contrast Given 09/09/23 2138)    ED Course/ Medical Decision  Making/ A&P Clinical Course as of 09/09/23 2359  Mon Sep 09, 2023  2129 Total Bilirubin(!!): 29.7 [MT]    Clinical Course User Index [MT] Terald Sleeper, MD                                 Medical Decision Making Amount and/or Complexity of Data Reviewed Labs: ordered. Decision-making details documented in ED Course. Radiology: ordered. ECG/medicine tests: ordered.  Risk Prescription drug management.   This patient presents to the ED with concern for abnormal labs, jaundice. This involves an extensive number of treatment options, and is a complaint that carries with it a high risk of complications and morbidity.  The differential diagnosis includes liver failure for cirrhosis versus biliary outlet obstruction versus hepatitis versus other  Co-morbidities that complicate the patient evaluation: History of chronic and heavy alcohol use at high risk of cirrhosis and alcoholic complications of the liver  I ordered and personally interpreted labs.  The pertinent results include: Transaminitis with mild elevation of ALT, AST, alk phos.  Bilirubin level 29.7.  Potassium 2.4.  Ammonia 64.  White blood cell count 14.2.  Lipase 72.  Hepatitis panel is pending.  Magnesium normal.  I ordered imaging studies including ct abdomen pelvis with contrast, which is pending at the time of signout  The patient was maintained on a cardiac monitor.  I personally viewed and interpreted the cardiac monitored which showed an underlying rhythm of: Sinus rhythm  Per my interpretation the patient's ECG shows sinus rhythm  I ordered medication including IV  potassium for hypokalemia  I have reviewed the patients home medicines and have made adjustments as needed   After the interventions noted above, I reevaluated the patient and found that they have: stayed the same -patient remains minimally symptomatic, comfortable in the bed.  Social Determinants of Health: patient encouraged & commended for alcohol cessation  Dispostion:  After consideration of the diagnostic results and the patients response to treatment, I feel that the patent would benefit from medical admission.  Patient is signed out to Dr Gloris Manchester EDP at 12 AM pending follow up on CT imaging prior to medical admission.         Final Clinical Impression(s) / ED Diagnoses Final diagnoses:  Transaminitis  Hypokalemia  Total bilirubin, elevated  Jaundice    Rx / DC Orders ED Discharge Orders     None         Thuy Atilano, Kermit Balo, MD 09/09/23 2359

## 2023-09-09 NOTE — ED Triage Notes (Signed)
Patient sent in by PCP due to abnormal labs. Patient has bloodwork done today.  Critical labs per MD- Potassium 6.2 and Total Bilirubin 26.2  Patient jaundice on arrival. Starting having issues with liver two weeks ago.   Patient complains of generalized weakness and pain. Rates pain 5/10.

## 2023-09-09 NOTE — ED Provider Notes (Signed)
Care of patient assumed from Dr. Renaye Rakers.  This patient presents with acute jaundice, present over the past week.  CT scan is currently pending.  He will require admission. Physical Exam  BP 118/77   Pulse (!) 101   Temp 98.6 F (37 C) (Oral)   Resp 15   Ht 5\' 8"  (1.727 m)   Wt 56.3 kg   SpO2 97%   BMI 18.88 kg/m   Physical Exam Vitals and nursing note reviewed.  Constitutional:      General: He is not in acute distress.    Appearance: He is well-developed and normal weight. He is ill-appearing. He is not toxic-appearing or diaphoretic.  HENT:     Head: Normocephalic and atraumatic.     Right Ear: External ear normal.     Left Ear: External ear normal.     Nose: Nose normal.     Mouth/Throat:     Mouth: Mucous membranes are moist.  Eyes:     General: Scleral icterus present.     Extraocular Movements: Extraocular movements intact.  Cardiovascular:     Rate and Rhythm: Normal rate and regular rhythm.  Pulmonary:     Effort: Pulmonary effort is normal. No respiratory distress.  Abdominal:     General: There is no distension.     Palpations: Abdomen is soft.  Musculoskeletal:        General: No swelling. Normal range of motion.     Cervical back: Normal range of motion and neck supple.  Skin:    General: Skin is warm and dry.     Coloration: Skin is jaundiced. Skin is not pale.  Neurological:     General: No focal deficit present.     Mental Status: He is alert and oriented to person, place, and time.  Psychiatric:        Mood and Affect: Mood normal.        Behavior: Behavior normal.     Procedures  Procedures  ED Course / MDM   Clinical Course as of 09/09/23 2356  Mon Sep 09, 2023  2129 Total Bilirubin(!!): 29.7 [MT]    Clinical Course User Index [MT] Terald Sleeper, MD   Medical Decision Making Amount and/or Complexity of Data Reviewed Labs: ordered. Decision-making details documented in ED Course. Radiology: ordered. ECG/medicine tests:  ordered.  Risk Prescription drug management.   On assessment, patient resting comfortably.  He is markedly jaundiced.  CT scan showed findings consistent with cirrhosis and portal hypertension.  There is also some per cholecystic fluid and concern of gallbladder wall thickening.  Right upper quadrant ultrasound was ordered for further evaluation.  Of note, patient denies any abdominal pain.  I spoke with gastroenterologist on-call, Dr. Ewing Schlein, who will see the patient in the morning.  He did have recommendations for additional lab work, which were ordered.  Patient would benefit from initiation of steroids, 50 mg of prednisone daily.  Dr. Ewing Schlein recommends ruling out infection first.  Patient does have a white blood cell count.  Ultrasound of gallbladder was pending, however, I suspect pericholecystic fluid is secondary to his cirrhosis and poor oncotic pressure.  Ultrasound findings also consistent with reactive in the setting of chronic liver disease.  No cholelithiasis was identified.  Patient to be admitted for further management.       Gloris Manchester, MD 09/10/23 [phone]

## 2023-09-10 ENCOUNTER — Emergency Department (HOSPITAL_COMMUNITY): Payer: BC Managed Care – PPO

## 2023-09-10 ENCOUNTER — Ambulatory Visit (HOSPITAL_BASED_OUTPATIENT_CLINIC_OR_DEPARTMENT_OTHER): Admission: RE | Admit: 2023-09-10 | Payer: BC Managed Care – PPO | Source: Ambulatory Visit

## 2023-09-10 ENCOUNTER — Other Ambulatory Visit: Payer: Self-pay

## 2023-09-10 ENCOUNTER — Encounter (HOSPITAL_COMMUNITY): Payer: Self-pay | Admitting: Internal Medicine

## 2023-09-10 ENCOUNTER — Inpatient Hospital Stay (HOSPITAL_COMMUNITY): Payer: BC Managed Care – PPO

## 2023-09-10 DIAGNOSIS — K219 Gastro-esophageal reflux disease without esophagitis: Secondary | ICD-10-CM | POA: Diagnosis present

## 2023-09-10 DIAGNOSIS — F17210 Nicotine dependence, cigarettes, uncomplicated: Secondary | ICD-10-CM | POA: Diagnosis present

## 2023-09-10 DIAGNOSIS — I8500 Esophageal varices without bleeding: Secondary | ICD-10-CM | POA: Diagnosis not present

## 2023-09-10 DIAGNOSIS — F17200 Nicotine dependence, unspecified, uncomplicated: Secondary | ICD-10-CM | POA: Diagnosis not present

## 2023-09-10 DIAGNOSIS — K7030 Alcoholic cirrhosis of liver without ascites: Secondary | ICD-10-CM | POA: Diagnosis not present

## 2023-09-10 DIAGNOSIS — F1021 Alcohol dependence, in remission: Secondary | ICD-10-CM

## 2023-09-10 DIAGNOSIS — K7010 Alcoholic hepatitis without ascites: Secondary | ICD-10-CM | POA: Insufficient documentation

## 2023-09-10 DIAGNOSIS — K760 Fatty (change of) liver, not elsewhere classified: Secondary | ICD-10-CM | POA: Diagnosis present

## 2023-09-10 DIAGNOSIS — R932 Abnormal findings on diagnostic imaging of liver and biliary tract: Secondary | ICD-10-CM | POA: Diagnosis not present

## 2023-09-10 DIAGNOSIS — Z91410 Personal history of adult physical and sexual abuse: Secondary | ICD-10-CM | POA: Diagnosis not present

## 2023-09-10 DIAGNOSIS — D72829 Elevated white blood cell count, unspecified: Secondary | ICD-10-CM | POA: Diagnosis not present

## 2023-09-10 DIAGNOSIS — D689 Coagulation defect, unspecified: Secondary | ICD-10-CM | POA: Diagnosis present

## 2023-09-10 DIAGNOSIS — I8510 Secondary esophageal varices without bleeding: Secondary | ICD-10-CM | POA: Diagnosis present

## 2023-09-10 DIAGNOSIS — K766 Portal hypertension: Secondary | ICD-10-CM | POA: Diagnosis not present

## 2023-09-10 DIAGNOSIS — E875 Hyperkalemia: Secondary | ICD-10-CM | POA: Diagnosis present

## 2023-09-10 DIAGNOSIS — E876 Hypokalemia: Secondary | ICD-10-CM | POA: Diagnosis not present

## 2023-09-10 DIAGNOSIS — F1010 Alcohol abuse, uncomplicated: Secondary | ICD-10-CM | POA: Diagnosis present

## 2023-09-10 DIAGNOSIS — R945 Abnormal results of liver function studies: Secondary | ICD-10-CM | POA: Diagnosis not present

## 2023-09-10 DIAGNOSIS — R17 Unspecified jaundice: Secondary | ICD-10-CM | POA: Diagnosis not present

## 2023-09-10 DIAGNOSIS — K5730 Diverticulosis of large intestine without perforation or abscess without bleeding: Secondary | ICD-10-CM | POA: Diagnosis present

## 2023-09-10 DIAGNOSIS — R7401 Elevation of levels of liver transaminase levels: Principal | ICD-10-CM

## 2023-09-10 DIAGNOSIS — R935 Abnormal findings on diagnostic imaging of other abdominal regions, including retroperitoneum: Secondary | ICD-10-CM | POA: Diagnosis not present

## 2023-09-10 DIAGNOSIS — E871 Hypo-osmolality and hyponatremia: Secondary | ICD-10-CM | POA: Diagnosis present

## 2023-09-10 DIAGNOSIS — R531 Weakness: Secondary | ICD-10-CM | POA: Diagnosis not present

## 2023-09-10 DIAGNOSIS — R933 Abnormal findings on diagnostic imaging of other parts of digestive tract: Secondary | ICD-10-CM | POA: Diagnosis not present

## 2023-09-10 LAB — COMPREHENSIVE METABOLIC PANEL
ALT: 60 U/L — ABNORMAL HIGH (ref 0–44)
AST: 127 U/L — ABNORMAL HIGH (ref 15–41)
Albumin: 2 g/dL — ABNORMAL LOW (ref 3.5–5.0)
Alkaline Phosphatase: 139 U/L — ABNORMAL HIGH (ref 38–126)
Anion gap: 10 (ref 5–15)
BUN: <5 mg/dL — ABNORMAL LOW (ref 6–20)
CO2: 23 mmol/L (ref 22–32)
Calcium: 7.7 mg/dL — ABNORMAL LOW (ref 8.9–10.3)
Chloride: 99 mmol/L (ref 98–111)
Creatinine, Ser: <0.3 mg/dL — ABNORMAL LOW (ref 0.61–1.24)
Glucose, Bld: 79 mg/dL (ref 70–99)
Potassium: 2.6 mmol/L — CL (ref 3.5–5.1)
Sodium: 132 mmol/L — ABNORMAL LOW (ref 135–145)
Total Bilirubin: 23.7 mg/dL (ref <1.2)
Total Protein: 5.6 g/dL — ABNORMAL LOW (ref 6.5–8.1)

## 2023-09-10 LAB — LIPASE, BLOOD: Lipase: 54 U/L — ABNORMAL HIGH (ref 11–51)

## 2023-09-10 LAB — CBC WITH DIFFERENTIAL/PLATELET
Abs Immature Granulocytes: 0.15 10*3/uL — ABNORMAL HIGH (ref 0.00–0.07)
Basophils Absolute: 0.1 10*3/uL (ref 0.0–0.1)
Basophils Relative: 1 %
Eosinophils Absolute: 0.2 10*3/uL (ref 0.0–0.5)
Eosinophils Relative: 2 %
HCT: 34.3 % — ABNORMAL LOW (ref 39.0–52.0)
Hemoglobin: 12.3 g/dL — ABNORMAL LOW (ref 13.0–17.0)
Immature Granulocytes: 1 %
Lymphocytes Relative: 20 %
Lymphs Abs: 2.2 10*3/uL (ref 0.7–4.0)
MCH: 35.5 pg — ABNORMAL HIGH (ref 26.0–34.0)
MCHC: 35.9 g/dL (ref 30.0–36.0)
MCV: 99.1 fL (ref 80.0–100.0)
Monocytes Absolute: 1.2 10*3/uL — ABNORMAL HIGH (ref 0.1–1.0)
Monocytes Relative: 11 %
Neutro Abs: 7.4 10*3/uL (ref 1.7–7.7)
Neutrophils Relative %: 65 %
Platelets: 234 10*3/uL (ref 150–400)
RBC: 3.46 MIL/uL — ABNORMAL LOW (ref 4.22–5.81)
RDW: 22 % — ABNORMAL HIGH (ref 11.5–15.5)
WBC: 11.3 10*3/uL — ABNORMAL HIGH (ref 4.0–10.5)
nRBC: 0 % (ref 0.0–0.2)

## 2023-09-10 LAB — MAGNESIUM: Magnesium: 2.1 mg/dL (ref 1.7–2.4)

## 2023-09-10 LAB — PROTIME-INR
INR: 1.4 — ABNORMAL HIGH (ref 0.8–1.2)
Prothrombin Time: 17 s — ABNORMAL HIGH (ref 11.4–15.2)

## 2023-09-10 LAB — URINALYSIS, COMPLETE (UACMP) WITH MICROSCOPIC
Glucose, UA: NEGATIVE mg/dL
Hgb urine dipstick: NEGATIVE
Ketones, ur: 5 mg/dL — AB
Leukocytes,Ua: NEGATIVE
Nitrite: NEGATIVE
Protein, ur: NEGATIVE mg/dL
Specific Gravity, Urine: >1.046 — ABNORMAL HIGH (ref 1.005–1.030)
pH: 6 (ref 5.0–8.0)

## 2023-09-10 LAB — ACETAMINOPHEN LEVEL: Acetaminophen (Tylenol), Serum: <10 ug/mL — ABNORMAL LOW (ref 10–30)

## 2023-09-10 LAB — PHOSPHORUS: Phosphorus: 2.7 mg/dL (ref 2.5–4.6)

## 2023-09-10 LAB — FERRITIN: Ferritin: 245 ng/mL (ref 24–336)

## 2023-09-10 LAB — AMMONIA: Ammonia: 39 umol/L — ABNORMAL HIGH (ref 9–35)

## 2023-09-10 MED ORDER — LORAZEPAM 2 MG/ML IJ SOLN: 1.0000 mg | INTRAMUSCULAR | Status: AC | PRN

## 2023-09-10 MED ORDER — LORAZEPAM 1 MG PO TABS: 1.0000 mg | ORAL_TABLET | ORAL | Status: AC | PRN

## 2023-09-10 MED ORDER — THIAMINE HCL 100 MG/ML IJ SOLN
100.0000 mg | Freq: Every day | INTRAMUSCULAR | Status: DC
  Filled 2023-09-10: qty 2

## 2023-09-10 MED ORDER — PREDNISOLONE 5 MG PO TABS
40.0000 mg | ORAL_TABLET | Freq: Every day | ORAL | Status: DC
  Administered 2023-09-11 – 2023-09-13 (×3): 40 mg via ORAL
  Filled 2023-09-10 (×4): qty 8

## 2023-09-10 MED ORDER — LACTULOSE 10 GM/15ML PO SOLN
30.0000 g | Freq: Once | ORAL | Status: AC
  Administered 2023-09-10: 30 g via ORAL
  Filled 2023-09-10: qty 60

## 2023-09-10 MED ORDER — FOLIC ACID 1 MG PO TABS
1.0000 mg | ORAL_TABLET | Freq: Every day | ORAL | Status: DC
  Administered 2023-09-10 – 2023-09-13 (×4): 1 mg via ORAL
  Filled 2023-09-10 (×4): qty 1

## 2023-09-10 MED ORDER — PREDNISONE 50 MG PO TABS
50.0000 mg | ORAL_TABLET | Freq: Every day | ORAL | Status: DC
  Administered 2023-09-10: 50 mg via ORAL
  Filled 2023-09-10: qty 1

## 2023-09-10 MED ORDER — METOCLOPRAMIDE HCL 5 MG/ML IJ SOLN
10.0000 mg | Freq: Once | INTRAMUSCULAR | Status: AC
  Administered 2023-09-10: 10 mg via INTRAVENOUS
  Filled 2023-09-10: qty 2

## 2023-09-10 MED ORDER — NICOTINE 7 MG/24HR TD PT24
7.0000 mg | MEDICATED_PATCH | Freq: Every day | TRANSDERMAL | Status: DC
  Administered 2023-09-10 – 2023-09-13 (×4): 7 mg via TRANSDERMAL
  Filled 2023-09-10 (×4): qty 1

## 2023-09-10 MED ORDER — NADOLOL 20 MG PO TABS
20.0000 mg | ORAL_TABLET | Freq: Every day | ORAL | Status: DC
  Administered 2023-09-11 – 2023-09-13 (×3): 20 mg via ORAL
  Filled 2023-09-10 (×3): qty 1

## 2023-09-10 MED ORDER — ONDANSETRON HCL 4 MG/2ML IJ SOLN: 4.0000 mg | Freq: Four times a day (QID) | INTRAMUSCULAR | Status: DC | PRN

## 2023-09-10 MED ORDER — ADULT MULTIVITAMIN W/MINERALS CH
1.0000 | ORAL_TABLET | Freq: Every day | ORAL | Status: DC
  Administered 2023-09-10 – 2023-09-13 (×4): 1 via ORAL
  Filled 2023-09-10 (×4): qty 1

## 2023-09-10 MED ORDER — THIAMINE MONONITRATE 100 MG PO TABS
100.0000 mg | ORAL_TABLET | Freq: Every day | ORAL | Status: DC
  Administered 2023-09-10 – 2023-09-13 (×4): 100 mg via ORAL
  Filled 2023-09-10 (×4): qty 1

## 2023-09-10 MED ORDER — MELATONIN 5 MG PO TABS: 5.0000 mg | ORAL_TABLET | Freq: Every evening | ORAL | Status: DC | PRN

## 2023-09-10 MED ORDER — POTASSIUM CHLORIDE CRYS ER 20 MEQ PO TBCR
40.0000 meq | EXTENDED_RELEASE_TABLET | Freq: Four times a day (QID) | ORAL | Status: AC
  Administered 2023-09-10 (×2): 40 meq via ORAL
  Filled 2023-09-10 (×2): qty 2

## 2023-09-10 MED ORDER — PANTOPRAZOLE SODIUM 40 MG PO TBEC
40.0000 mg | DELAYED_RELEASE_TABLET | Freq: Every day | ORAL | Status: DC
  Administered 2023-09-10 – 2023-09-13 (×4): 40 mg via ORAL
  Filled 2023-09-10 (×4): qty 1

## 2023-09-10 MED ORDER — ONDANSETRON HCL 4 MG PO TABS: 4.0000 mg | ORAL_TABLET | Freq: Four times a day (QID) | ORAL | Status: DC | PRN

## 2023-09-10 NOTE — Plan of Care (Signed)
The patient has rested comfortably since his admission from the ED. CIWA performed every 6 hours with no s/s of withdrawal. He remains jaundiced. He ambulates independently without difficulty. VSS. Fall precautions in place. Will continue to monitor.  Problem: Education: Goal: Knowledge of General Education information will improve Description: Including pain rating scale, medication(s)/side effects and non-pharmacologic comfort measures Outcome: Progressing   Problem: Health Behavior/Discharge Planning: Goal: Ability to manage health-related needs will improve Outcome: Progressing   Problem: Clinical Measurements: Goal: Ability to maintain clinical measurements within normal limits will improve Outcome: Progressing Goal: Will remain free from infection Outcome: Progressing Goal: Diagnostic test results will improve Outcome: Progressing Goal: Respiratory complications will improve Outcome: Progressing Goal: Cardiovascular complication will be avoided Outcome: Progressing   Problem: Activity: Goal: Risk for activity intolerance will decrease Outcome: Progressing   Problem: Nutrition: Goal: Adequate nutrition will be maintained Outcome: Progressing   Problem: Coping: Goal: Level of anxiety will decrease Outcome: Progressing   Problem: Elimination: Goal: Will not experience complications related to bowel motility Outcome: Progressing Goal: Will not experience complications related to urinary retention Outcome: Progressing   Problem: Pain Management: Goal: General experience of comfort will improve Outcome: Progressing   Problem: Safety: Goal: Ability to remain free from injury will improve Outcome: Progressing   Problem: Skin Integrity: Goal: Risk for impaired skin integrity will decrease Outcome: Progressing

## 2023-09-10 NOTE — H&P (Signed)
History and Physical    Patient: Thomas Wyatt:[id number] DOB: 28-Oct-1996 DOA: 09/09/2023 DOS: the patient was seen and examined on 09/10/2023 PCP: Sheliah Hatch, PA-C  Patient coming from: Home  Chief Complaint: No chief complaint on file.  HPI: Thomas Wyatt is a 27 y.o. male with medical history significant of alcohol use disorder who presents to [facility], ED with worsening jaundice for the past 10 days as well as persistent nausea and vomiting approximately 2 weeks. The nausea and vomiting resolved and jaundice remains. He was recently seen by his PCP for outpatient testing for jaundice noticed during a neurosurgery visit.  Labs from the PCP were reportedly significant for elevated bilirubin at 26.2 and hyperkalemia with potassium of 6.2.  Mr. Mccurty endorses a history of heavy alcohol drinking for which he recently weaned himself down and eventually stopped after noticing the yellowing of his skin. 10 days ago he had been drinking 6-8 drinks or shots daily and since that time has weaned himself down to a single shot and reports that 3 days ago was his last drink.  He denies any tremors or withdrawal symptoms. He reports that he is currently engaged and is sexually monogamous. Denies IV drug use.  ED Course: On arrival to [facility] ED he was noted to be afebrile with a temp of 37.6C, BP 130/84, HR 110, RR 16, SpO2 98% on room air.  Labs notable for leukocytosis WBC 14.2, sodium 130, potassium low at 2.4, chloride 92, AST/ALT 157 and 75 respectively, alk phos 187, T. bili 1.7, albumin 2.6 and total protein 7.1.  Creatinine 0.49, glucose slightly elevated at 109.  Lipase elevated at 72, PT/INR minimally elevated at 16 and 1.3 respectively, ammonia 64.  Hepatitis B and C panel negative and acetaminophen level is less than 10.  CT abdomen pelvis obtained and shows hepatomegaly and hepatic steatosis with cirrhosis and esophageal and paraesophageal varices. Right upper quadrant ultrasound  obtained for better evaluation of the gallbladder and did not show any cholelithiasis.  Dr. Scharlene Corn with GI consulted by EDP who recommended starting prednisone 40 mg daily after ruling out current infection and [facility] will see in consult.  [facility] contacted for admission.  Review of Systems: As mentioned in the history of present illness. All other systems reviewed and are negative. No past medical history on file. No past surgical history on file. Social History:  reports that he has been smoking cigarettes. He has never used smokeless tobacco. He reports current alcohol use of about 10.0 standard drinks of alcohol per week. He reports that he does not use drugs.  No Known Allergies  No family history on file.  Prior to Admission medications   Medication Sig Start Date End Date Taking? Authorizing Provider  cyclobenzaprine (FLEXERIL) 5 MG tablet Take 1 tablet (5 mg total) by mouth 2 (two) times daily as needed for muscle spasms. Patient not taking: Reported on 09/10/2023 03/08/23   Small, Brooke L, PA  famotidine (PEPCID) 20 MG tablet Take 2 tablets (40 mg total) by mouth daily. Patient not taking: Reported on 09/10/2023 03/13/22   Gailen Shelter, PA  folic acid (FOLVITE) 1 MG tablet Take 1 tablet (1 mg total) by mouth daily. Patient not taking: Reported on 09/10/2023 03/13/22   Gailen Shelter, PA  naproxen (NAPROSYN) 500 MG tablet Take 1 tablet (500 mg total) by mouth 2 (two) times daily. Patient not taking: Reported on 09/10/2023 03/08/23   Small, Harley [place], PA  ondansetron (ZOFRAN-ODT) 4 MG disintegrating tablet Take 1 tablet (4 mg total) by mouth every 8 (eight) hours as needed for nausea or vomiting. Patient not taking: Reported on 09/10/2023 03/13/22   Gailen Shelter, PA  vitamin B-12 (CYANOCOBALAMIN) 100 MCG tablet Take 1 tablet (100 mcg total) by mouth daily. Patient not taking: Reported on 09/10/2023 03/13/22   Gailen Shelter, [place]    Physical Exam: Vitals:   09/10/23 0630  09/10/23 0700 09/10/23 0820 09/10/23 0830  BP: 107/70 106/72 114/76   Pulse: 86 87 95   Resp: 19 (!) 21 (!) 22   Temp:    98.8 F (37.1 C)  TempSrc:    Oral  SpO2: 94% 94% 97%   Weight:      Height:       Constitutional: NAD, calm, comfortable Eyes: PERRL, lids and conjunctivae normal. Scleral icterus noted ENMT: Mucous membranes are moist. Posterior pharynx clear of any exudate or lesions. Poor dentition.  Neck: normal, supple, no masses, no thyromegaly Respiratory: clear to auscultation bilaterally, no wheezing, no crackles. Normal respiratory effort. No accessory muscle use.  Cardiovascular: Regular rate and rhythm, no murmurs / rubs / gallops. No extremity edema. 2+ radial and pedal pulses.   Abdomen: no abdominal tenderness.  Hepatomegaly. Bowel sounds positive x4.  Musculoskeletal: no clubbing / cyanosis. No joint deformity upper and lower extremities. Good ROM, no contractures. Normal muscle tone. No asterixis on exam. Skin: no rashes, lesions, ulcers. No induration. Full body Jaundice Neurologic: CN 2-12 grossly intact. Sensation intact. Strength 5/5 x all 4 extremities. Alert and oriented x3. Psychiatric: Normal judgment and insight. Normal mood.   Data Reviewed: CBC    Component Value Date/Time   WBC 11.3 (H) 09/10/2023 0835   RBC 3.46 (L) 09/10/2023 0835   HGB 12.3 (L) 09/10/2023 0835   HCT 34.3 (L) 09/10/2023 0835   PLT 234 09/10/2023 0835   MCV 99.1 09/10/2023 0835   MCH 35.5 (H) 09/10/2023 0835   MCHC 35.9 09/10/2023 0835   RDW 22.0 (H) 09/10/2023 0835   LYMPHSABS PENDING 09/10/2023 0835   MONOABS PENDING 09/10/2023 0835   EOSABS PENDING 09/10/2023 0835   BASOSABS PENDING 09/10/2023 0835   CMP     Component Value Date/Time   NA 130 (L) 09/09/2023 2040   K 2.4 (LL) 09/09/2023 2040   CL 92 (L) 09/09/2023 2040   CO2 25 09/09/2023 2040   GLUCOSE 109 (H) 09/09/2023 2040   BUN <5 (L) 09/09/2023 2040   CREATININE 0.49 (L) 09/09/2023 2040   CALCIUM 8.4 (L)  09/09/2023 2040   PROT 7.1 09/09/2023 2040   ALBUMIN 2.6 (L) 09/09/2023 2040   AST 157 (H) 09/09/2023 2040   ALT 75 (H) 09/09/2023 2040   ALKPHOS 187 (H) 09/09/2023 2040   BILITOT 29.7 (HH) 09/09/2023 2040   GFRNONAA >60 09/09/2023 2040   Lipase     Component Value Date/Time   LIPASE 54 (H) 09/10/2023 0835   Protime-INR    Component Value Date/Time   PROTHROMBIN TIME INR 16.0 1.3 09/09/2023 2040   Hepatitis Panel, acute    Component Value Date/Time   ETH <10 09/09/2023 2040   Ammonia    Component Value Date/Time   AMMONIA 64 09/09/2023 2040   Magnesium    Component Value Date/Time   MAGNESIUM 2.2 09/09/2023 2146   Acetaminophen Level    Component Value Date/Time   ACETAMINOPHEN, SERUM <10 09/09/2023 2352   DG Chest 2 View  Result Date: 09/10/2023 CLINICAL DATA:  Leukocytosis. EXAM: CHEST - 2 VIEW COMPARISON:  03/13/2022 FINDINGS: The heart size and mediastinal contours are within normal limits. Both lungs are clear. The visualized skeletal structures are unremarkable. IMPRESSION: No active cardiopulmonary disease. Electronically Signed   By: Danae Orleans M.D.   On: 09/10/2023 08:52   US Abdomen Limited RUQ (LIVER/GB)  Result Date: 09/10/2023 CLINICAL DATA:  Generalized weakness. EXAM: ULTRASOUND ABDOMEN LIMITED RIGHT UPPER QUADRANT COMPARISON:  Abdominal CT from yesterday FINDINGS: Gallbladder: Edematous wall thickening of the gallbladder. No over distension or calculus. No focal tenderness. Common bile duct: Diameter: 4 mm. Liver: Echogenic and heterogeneous parenchyma in the setting of known cirrhosis. No gross lesion. Portal vein is patent on color Doppler imaging with normal direction of blood flow towards the liver. IMPRESSION: Gallbladder wall thickening considered reactive in the setting of chronic liver disease. No cholelithiasis. Electronically Signed   By: Tiburcio Pea M.D.   On: 09/10/2023 06:46   CT ABDOMEN PELVIS W CONTRAST  Result Date:  09/10/2023 CLINICAL DATA:  concern for biliary obstruction - evaluation for mass Patient sent in by PCP due to abnormal labs. Patient has bloodwork done today. Critical labs per MD- Potassium 6.2 and Total Bilirubin 26.2 Patient jaundice on arrival EXAM: CT ABDOMEN AND PELVIS WITH CONTRAST TECHNIQUE: Multidetector CT imaging of the abdomen and pelvis was performed using the standard protocol following bolus administration of intravenous contrast. RADIATION DOSE REDUCTION: This exam was performed according to the departmental dose-optimization program which includes automated exposure control, adjustment of the mA and/or kV according to patient size and/or use of iterative reconstruction technique. CONTRAST:  OMNIPAQUE IOHEXOL 300 MG/ML  SOLN COMPARISON:  None Available. FINDINGS: Lower chest: No acute abnormality. Hepatobiliary: The liver is enlarged measuring up to 27 cm. The hepatic parenchyma is markedly heterogeneous and diffusely hypodense compared to the splenic parenchyma consistent with fatty infiltration. Enlarged caudate lobe. No focal liver abnormality. Contracted gallbladder. Contracted gallbladder with pericholecystic fluid in question gallbladder wall thickening. No biliary dilatation. Pancreas: No focal lesion. Normal pancreatic contour. No surrounding inflammatory changes. No main pancreatic ductal dilatation. Spleen: Enlarged in size measuring up to 14.5 cm.  No focal lesion. Adrenals/Urinary Tract: No adrenal nodule bilaterally. Bilateral kidneys enhance symmetrically. Punctate right nephrolithiasis. No hydronephrosis. No hydroureter. The urinary bladder is unremarkable. Stomach/Bowel: Stomach is within normal limits. No evidence of bowel wall thickening or dilatation. Colonic diverticulosis. Appendix appears normal. Vascular/Lymphatic: Paraesophageal and esophageal varices. Recanalized paraumbilical vein. No abdominal aorta or iliac aneurysm. Mild atherosclerotic plaque of the aorta and its  branches. No abdominal, pelvic, or inguinal lymphadenopathy. Reproductive: Prostate is unremarkable. Other: Trace to small volume simple free fluid. No intraperitoneal free gas. No organized fluid collection. Musculoskeletal: No abdominal wall hernia or abnormality. No suspicious lytic or blastic osseous lesions. No acute displaced fracture. IMPRESSION: 1. Hepatomegaly and hepatic steatosis with cirrhosis and portal hypertension. Heterogeneous hepatic parenchyma with limited evaluation for focal hepatic lesion on this single-phase study. Recommend MRI liver protocol for further evaluation. 2. Contracted gallbladder with pericholecystic fluid and question gallbladder wall thickening. Recommend right upper quadrant ultrasound for a more sensitive evaluation of the gallbladder. 3. Trace to small volume simple free fluid. 4. Colonic diverticulosis with no acute diverticulitis. 5. Nonobstructive punctate right nephrolithiasis. Electronically Signed   By: Tish Frederickson M.D.   On: 09/10/2023 00:37       Assessment and Plan: #Alcoholic Hepatitis Presents with 10 day history of jaundice in setting of history of significant alcohol use. Alert and oriented on  assessment. Initial workup notable for AST 157 ALT 75 Tbili 29.7 INR 1.3 Ammonia 64 with Maddrey's score of 33.4. As Maddrey's >32 treatment with corticosteroids is indicated. Moderate leukocytosis not uncommon in alcoholic hepatitis, will exclude infectious/viral etiologies of acute hepatitis as below. - Prednisone 40 mg daily - GI prophylaxis with Protonix - Hepatitis B and Hepatitis C negative - UA, CXR - Daily CMP, PT/INR - CIWA protocol - GI consulted, appreciate their recommendations and management  #Esophageal Varices #Portal Hypertension - Start Nadolol 20 mg daily - Protonix  #Hypokalemia - 40 mg PO K x2 ordered - Follow up CMP with AM labs  #Alcohol Use Disorder - CIWA protocol with PRN Ativan - Counseled on importance of continued  cessation  #Tobacco Use Disorder - NRT - Counseled on cessation  VTE prophylaxis: SCDs GI prophylaxis: Protonix Diet: Regular Access: PIV Lines: NONE Code Status: FULL Telemetry: Yes Disposition: Admit to Med-Surg with Telemetry  Advance Care Planning:   Code Status: Full Code   Consults: [facility]  Family Communication: No family at beside  Severity of Illness: The appropriate patient status for this patient is INPATIENT. Inpatient status is judged to be reasonable and necessary in order to provide the required intensity of service to ensure the patient's safety. The patient's presenting symptoms, physical exam findings, and initial radiographic and laboratory data in the context of their chronic comorbidities is felt to place them at high risk for further clinical deterioration. Furthermore, it is not anticipated that the patient will be medically stable for discharge from the hospital within 2 midnights of admission.   * I certify that at the point of admission it is my clinical judgment that the patient will require inpatient hospital care spanning beyond 2 midnights from the point of admission due to high intensity of service, high risk for further deterioration and high frequency of surveillance required.*  To reach the provider On-Call:   7AM- 7PM see care teams to locate the attending and reach out to them via [email or web address]. Password: [email or web address] 7PM-7AM contact night-coverage If you still have difficulty reaching the appropriate provider, please page the [facility] (Director on Call) for [facility] on amion for assistance  This document was prepared using Conservation officer, historic buildings and may include unintentional dictation errors.  Bishop Limbo FNP-BC, PMHNP-BC Nurse Practitioner [facility]

## 2023-09-10 NOTE — Progress Notes (Signed)
   09/10/23 1510  TOC Brief Assessment  Insurance and Status Reviewed  Patient has primary care physician Yes Bryon Lions, Gigi Gin, PA-C)  Home environment has been reviewed yes  Prior level of function: Independent  Prior/Current Home Services No current home services  Social Determinants of Health Reivew SDOH reviewed no interventions necessary  Readmission risk has been reviewed Yes  Transition of care needs no transition of care needs at this time

## 2023-09-10 NOTE — ED Notes (Signed)
ED TO INPATIENT HANDOFF REPORT  ED Nurse Name and Phone #: Carolynne Schuchard Woodroe Chen, Paramedic   S Name/Age/Gender Thomas Wyatt 27 y.o. male Room/Bed: WA02/WA02  Code Status   Code Status: Full Code  Home/SNF/Other Home Patient oriented to: self, place, time, and situation Is this baseline? Yes   Triage Complete: Triage complete  Chief Complaint Jaundice [R17]  Triage Note Patient sent in by PCP due to abnormal labs. Patient has bloodwork done today.  Critical labs per MD- Potassium 6.2 and Total Bilirubin 26.2  Patient jaundice on arrival. Starting having issues with liver two weeks ago.   Patient complains of generalized weakness and pain. Rates pain 5/10.   Allergies No Known Allergies  Level of Care/Admitting Diagnosis ED Disposition     ED Disposition  Admit   Condition  --   Comment  Hospital Area: [facility] [[id number]]  Level of Care: Telemetry [5]  Admit to tele based on following criteria: Other see comments  Comments: hx etoh, with acogoloic hepatitis prob.  May admit patient to [facility] or [facility] if equivalent level of care is available:: No  Covid Evaluation: Asymptomatic - no recent exposure (last 10 days) testing not required  Diagnosis: Jaundice [[id number]]  Admitting Physician: Rodolph Bong [[id number]]  Attending Physician: Pricilla Loveless [phone]  Certification:: I certify this patient will need inpatient services for at least 2 midnights  Expected Medical Readiness: 09/14/2023          B Medical/Surgery History No past medical history on file. No past surgical history on file.   A IV Location/Drains/Wounds Patient Lines/Drains/Airways Status     Active Line/Drains/Airways     Name Placement date Placement time Site Days   Peripheral IV 09/09/23 20 G Right Antecubital 09/09/23  2046  Antecubital  1            Intake/Output Last 24 hours No intake or output data in the 24 hours ending 09/10/23  0855  Labs/Imaging Results for orders placed or performed during the hospital encounter of 09/09/23 (from the past 48 hour(s))  CBC with Differential     Status: Abnormal   Collection Time: 09/09/23  8:40 PM  Result Value Ref Range   WBC 14.2 (H) 4.0 - 10.5 K/uL   RBC 3.67 (L) 4.22 - 5.81 MIL/uL   Hemoglobin 13.3 13.0 - 17.0 g/dL   HCT 01.0 (L) 27.2 - 53.6 %   MCV 99.7 80.0 - 100.0 fL   MCH 36.2 (H) 26.0 - 34.0 pg   MCHC 36.3 (H) 30.0 - 36.0 g/dL   RDW 64.4 (H) 03.4 - 74.2 %   Platelets 245 150 - 400 K/uL   nRBC 0.0 0.0 - 0.2 %   Neutrophils Relative % 71 %   Neutro Abs 10.2 (H) 1.7 - 7.7 K/uL   Lymphocytes Relative 14 %   Lymphs Abs 1.9 0.7 - 4.0 K/uL   Monocytes Relative 11 %   Monocytes Absolute 1.5 (H) 0.1 - 1.0 K/uL   Eosinophils Relative 1 %   Eosinophils Absolute 0.2 0.0 - 0.5 K/uL   Basophils Relative 1 %   Basophils Absolute 0.1 0.0 - 0.1 K/uL   Immature Granulocytes 2 %   Abs Immature Granulocytes 0.26 (H) 0.00 - 0.07 K/uL   Target Cells PRESENT     Comment: Performed at [facility], [address], [place], [place] [address]  Comprehensive metabolic panel     Status: Abnormal   Collection Time:  09/09/23  8:40 PM  Result Value Ref Range   Sodium 130 (L) 135 - 145 mmol/L   Potassium 2.4 (LL) 3.5 - 5.1 mmol/L    Comment: CRITICAL RESULT CALLED TO, READ BACK BY AND VERIFIED WITH GRAY, S RN @ 2126 ON 09/09/2023 BY MTA    Chloride 92 (L) 98 - 111 mmol/L   CO2 25 22 - 32 mmol/L   Glucose, Bld 109 (H) 70 - 99 mg/dL    Comment: Glucose reference range applies only to samples taken after fasting for at least 8 hours.   BUN <5 (L) 6 - 20 mg/dL   Creatinine, Ser 4.09 (L) 0.61 - 1.24 mg/dL    Comment: ICTERUS AT THIS LEVEL MAY AFFECT RESULT   Calcium 8.4 (L) 8.9 - 10.3 mg/dL   Total Protein 7.1 6.5 - 8.1 g/dL   Albumin 2.6 (L) 3.5 - 5.0 g/dL   AST 811 (H) 15 - 41 U/L   ALT 75 (H) 0 - 44 U/L   Alkaline Phosphatase 187 (H) 38 - 126 U/L   Total  Bilirubin 29.7 (HH) <1.2 mg/dL    Comment: CRITICAL RESULT CALLED TO, READ BACK BY AND VERIFIED WITH GRAY, S RN @ 2126 ON 09/09/2023 BY MTA    GFR, Estimated >60 >60 mL/min    Comment: (NOTE) Calculated using the CKD-EPI Creatinine Equation (2021)    Anion gap 13 5 - 15    Comment: Performed at [facility], [address] [place], [place] [address]  Lipase, blood     Status: Abnormal   Collection Time: 09/09/23  8:40 PM  Result Value Ref Range   Lipase 72 (H) 11 - 51 U/L    Comment: Performed at [facility], [address], [place], [place] [address]  Protime-INR     Status: Abnormal   Collection Time: 09/09/23  8:40 PM  Result Value Ref Range   Prothrombin Time 16.0 (H) 11.4 - 15.2 seconds   INR 1.3 (H) 0.8 - 1.2    Comment: (NOTE) INR goal varies based on device and disease states. Performed at [facility], [address] [place], [place] [address]   Hepatitis panel, acute     Status: None   Collection Time: 09/09/23  8:40 PM  Result Value Ref Range   Hepatitis B Surface Ag NON REACTIVE NON REACTIVE   HCV Ab NON REACTIVE NON REACTIVE    Comment: (NOTE) Nonreactive HCV antibody screen is consistent with no HCV infections,  unless recent infection is suspected or other evidence exists to indicate HCV infection.     Hep A IgM NON REACTIVE NON REACTIVE   Hep B C IgM NON REACTIVE NON REACTIVE    Comment: Performed at [facility], [address], [place], [place] [address]  Ammonia     Status: Abnormal   Collection Time: 09/09/23  8:40 PM  Result Value Ref Range   Ammonia 64 (H) 9 - 35 umol/L    Comment: Performed at [facility], [address], [place], [place] [address]  Magnesium     Status: None   Collection Time: 09/09/23  9:46 PM  Result Value Ref Range   Magnesium 2.2 1.7 - 2.4 mg/dL    Comment: Performed at [facility], [address] [place], [place]  [address]  Acetaminophen level     Status: Abnormal   Collection Time: 09/09/23 11:52 PM  Result Value Ref Range  Acetaminophen (Tylenol), Serum <10 (L) 10 - 30 ug/mL    Comment: (NOTE) Therapeutic concentrations vary significantly. A range of 10-30 ug/mL  may be an effective concentration for many patients. However, some  are best treated at concentrations outside of this range. Acetaminophen concentrations >150 ug/mL at 4 hours after ingestion  and >50 ug/mL at 12 hours after ingestion are often associated with  toxic reactions.  Performed at [facility], [address] [place], [place] [address]   Ferritin (Iron Binding Protein)     Status: None   Collection Time: 09/10/23  4:59 AM  Result Value Ref Range   Ferritin 245 24 - 336 ng/mL    Comment: ICTERUS AT THIS LEVEL MAY AFFECT RESULT Performed at [facility], [address], [place], [place] [address]    DG Chest 2 View  Result Date: 09/10/2023 CLINICAL DATA:  Leukocytosis. EXAM: CHEST - 2 VIEW COMPARISON:  03/13/2022 FINDINGS: The heart size and mediastinal contours are within normal limits. Both lungs are clear. The visualized skeletal structures are unremarkable. IMPRESSION: No active cardiopulmonary disease. Electronically Signed   By: Danae Orleans M.D.   On: 09/10/2023 08:52   US Abdomen Limited RUQ (LIVER/GB)  Result Date: 09/10/2023 CLINICAL DATA:  Generalized weakness. EXAM: ULTRASOUND ABDOMEN LIMITED RIGHT UPPER QUADRANT COMPARISON:  Abdominal CT from yesterday FINDINGS: Gallbladder: Edematous wall thickening of the gallbladder. No over distension or calculus. No focal tenderness. Common bile duct: Diameter: 4 mm. Liver: Echogenic and heterogeneous parenchyma in the setting of known cirrhosis. No gross lesion. Portal vein is patent on color Doppler imaging with normal direction of blood flow towards the liver. IMPRESSION: Gallbladder wall thickening considered reactive in the setting  of chronic liver disease. No cholelithiasis. Electronically Signed   By: Tiburcio Pea M.D.   On: 09/10/2023 06:46   CT ABDOMEN PELVIS W CONTRAST  Result Date: 09/10/2023 CLINICAL DATA:  concern for biliary obstruction - evaluation for mass Patient sent in by PCP due to abnormal labs. Patient has bloodwork done today. Critical labs per MD- Potassium 6.2 and Total Bilirubin 26.2 Patient jaundice on arrival EXAM: CT ABDOMEN AND PELVIS WITH CONTRAST TECHNIQUE: Multidetector CT imaging of the abdomen and pelvis was performed using the standard protocol following bolus administration of intravenous contrast. RADIATION DOSE REDUCTION: This exam was performed according to the departmental dose-optimization program which includes automated exposure control, adjustment of the mA and/or kV according to patient size and/or use of iterative reconstruction technique. CONTRAST:  OMNIPAQUE IOHEXOL 300 MG/ML  SOLN COMPARISON:  None Available. FINDINGS: Lower chest: No acute abnormality. Hepatobiliary: The liver is enlarged measuring up to 27 cm. The hepatic parenchyma is markedly heterogeneous and diffusely hypodense compared to the splenic parenchyma consistent with fatty infiltration. Enlarged caudate lobe. No focal liver abnormality. Contracted gallbladder. Contracted gallbladder with pericholecystic fluid in question gallbladder wall thickening. No biliary dilatation. Pancreas: No focal lesion. Normal pancreatic contour. No surrounding inflammatory changes. No main pancreatic ductal dilatation. Spleen: Enlarged in size measuring up to 14.5 cm.  No focal lesion. Adrenals/Urinary Tract: No adrenal nodule bilaterally. Bilateral kidneys enhance symmetrically. Punctate right nephrolithiasis. No hydronephrosis. No hydroureter. The urinary bladder is unremarkable. Stomach/Bowel: Stomach is within normal limits. No evidence of bowel wall thickening or dilatation. Colonic diverticulosis. Appendix appears normal.  Vascular/Lymphatic: Paraesophageal and esophageal varices. Recanalized paraumbilical vein. No abdominal aorta or iliac aneurysm. Mild atherosclerotic plaque of the aorta and its branches. No abdominal, pelvic, or inguinal lymphadenopathy. Reproductive: Prostate is unremarkable.  Other: Trace to small volume simple free fluid. No intraperitoneal free gas. No organized fluid collection. Musculoskeletal: No abdominal wall hernia or abnormality. No suspicious lytic or blastic osseous lesions. No acute displaced fracture. IMPRESSION: 1. Hepatomegaly and hepatic steatosis with cirrhosis and portal hypertension. Heterogeneous hepatic parenchyma with limited evaluation for focal hepatic lesion on this single-phase study. Recommend MRI liver protocol for further evaluation. 2. Contracted gallbladder with pericholecystic fluid and question gallbladder wall thickening. Recommend right upper quadrant ultrasound for a more sensitive evaluation of the gallbladder. 3. Trace to small volume simple free fluid. 4. Colonic diverticulosis with no acute diverticulitis. 5. Nonobstructive punctate right nephrolithiasis. Electronically Signed   By: Tish Frederickson M.D.   On: 09/10/2023 00:37    Pending Labs Unresulted Labs (From admission, onward)     Start     Ordered   09/11/23 0500  HIV Antibody (routine testing w rflx)  (HIV Antibody (Routine testing w reflex) panel)  Tomorrow morning,   R        09/10/23 0843   09/11/23 0500  Comprehensive metabolic panel  Tomorrow morning,   R        09/10/23 0843   09/11/23 0500  CBC  Tomorrow morning,   R        09/10/23 0843   09/10/23 0804  Ammonia  Once,   R        09/10/23 0803   09/10/23 0802  Lipase, blood  Once,   R        09/10/23 0801   09/10/23 0800  Phosphorus  Once,   R        09/10/23 0759   09/10/23 0759  Comprehensive metabolic panel  Once,   R        09/10/23 0759   09/10/23 0759  CBC with Differential/Platelet  Once,   R        09/10/23 0759   09/10/23 0759   Protime-INR  Once,   R        09/10/23 0759   09/10/23 0759  Urinalysis, Complete w Microscopic -Urine, Clean Catch  Once,   R       Question:  Specimen Source  Answer:  Urine, Clean Catch   09/10/23 0759   09/10/23 0759  Magnesium  Once,   R        09/10/23 0759   09/10/23 0759  Urine Culture (for pregnant, neutropenic or urologic patients or patients with an indwelling urinary catheter)  (Urine Labs)  Once,   R       Question:  Indication  Answer:  Dysuria   09/10/23 0759   09/10/23 0750  Ethanol  Add-on,   AD        09/10/23 0749   09/10/23 0425  Alpha-1-antitrypsin  Once,   URGENT        09/10/23 0424   09/10/23 0425  Anti-smooth muscle antibody, IgG  Once,   URGENT        09/10/23 0424   09/10/23 0424  Ceruloplasmin  Once,   URGENT        09/10/23 0424            Vitals/Pain Today's Vitals   09/10/23 0630 09/10/23 0700 09/10/23 0820 09/10/23 0830  BP: 107/70 106/72 114/76   Pulse: 86 87 95   Resp: 19 (!) 21 (!) 22   Temp:    98.8 F (37.1 C)  TempSrc:    Oral  SpO2: 94% 94% 97%   Weight:  Height:      PainSc:        Isolation Precautions No active isolations  Medications Medications  predniSONE (DELTASONE) tablet 50 mg (has no administration in time range)  ondansetron (ZOFRAN) tablet 4 mg (has no administration in time range)    Or  ondansetron (ZOFRAN) injection 4 mg (has no administration in time range)  melatonin tablet 5 mg (has no administration in time range)  LORazepam (ATIVAN) tablet 1-4 mg (has no administration in time range)    Or  LORazepam (ATIVAN) injection 1-4 mg (has no administration in time range)  thiamine (VITAMIN B1) tablet 100 mg (has no administration in time range)    Or  thiamine (VITAMIN B1) injection 100 mg (has no administration in time range)  folic acid (FOLVITE) tablet 1 mg (has no administration in time range)  multivitamin with minerals tablet 1 tablet (has no administration in time range)  iohexol (OMNIPAQUE) 300  MG/ML solution 100 mL (100 mLs Intravenous Contrast Given 09/09/23 2138)  potassium chloride 10 mEq in 100 mL IVPB (0 mEq Intravenous Stopped 09/10/23 0020)  lactulose (CHRONULAC) 10 GM/15ML solution 30 g (30 g Oral Given 09/10/23 0218)  metoCLOPramide (REGLAN) injection 10 mg (10 mg Intravenous Given 09/10/23 0303)    Mobility walks     Focused Assessments     R Recommendations: See Admitting Provider Note  Report given to:   Additional Notes:

## 2023-09-10 NOTE — Progress Notes (Signed)
   09/10/23 1512  OTHER  Substance Abuse Education Offered Yes  Substance abuse interventions  (refused)  (CAGE-AID) Substance Abuse Screening Tool  Have You Ever Felt You Ought to Cut Down on Your Drinking or Drug Use? 1  Have People Annoyed You By Critizing Your Drinking Or Drug Use? 0  Have You Felt Bad Or Guilty About Your Drinking Or Drug Use? 1  Have You Ever Had a Drink or Used Drugs First Thing In The Morning to Steady Your Nerves or to Get Rid of a Hangover? 1  CAGE-AID Score 3   Refused resources

## 2023-09-10 NOTE — ED Notes (Signed)
Patient able to take lactulose without vomiting

## 2023-09-10 NOTE — ED Provider Notes (Signed)
Case discussed with Dr. Janee Morn for admission.   Pricilla Loveless, MD 09/10/23 [phone]
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
## 2023-09-11 DIAGNOSIS — K766 Portal hypertension: Secondary | ICD-10-CM | POA: Diagnosis not present

## 2023-09-11 DIAGNOSIS — F17200 Nicotine dependence, unspecified, uncomplicated: Secondary | ICD-10-CM | POA: Diagnosis not present

## 2023-09-11 DIAGNOSIS — R7401 Elevation of levels of liver transaminase levels: Secondary | ICD-10-CM | POA: Diagnosis not present

## 2023-09-11 DIAGNOSIS — K7010 Alcoholic hepatitis without ascites: Secondary | ICD-10-CM | POA: Diagnosis not present

## 2023-09-11 LAB — PHOSPHORUS: Phosphorus: 1.9 mg/dL — ABNORMAL LOW (ref 2.5–4.6)

## 2023-09-11 LAB — PROTIME-INR
INR: 1.4 — ABNORMAL HIGH (ref 0.8–1.2)
Prothrombin Time: 17 s — ABNORMAL HIGH (ref 11.4–15.2)

## 2023-09-11 LAB — COMPREHENSIVE METABOLIC PANEL
ALT: 62 U/L — ABNORMAL HIGH (ref 0–44)
AST: 122 U/L — ABNORMAL HIGH (ref 15–41)
Albumin: 2.1 g/dL — ABNORMAL LOW (ref 3.5–5.0)
Alkaline Phosphatase: 140 U/L — ABNORMAL HIGH (ref 38–126)
Anion gap: 9 (ref 5–15)
BUN: 5 mg/dL — ABNORMAL LOW (ref 6–20)
CO2: 23 mmol/L (ref 22–32)
Calcium: 8 mg/dL — ABNORMAL LOW (ref 8.9–10.3)
Chloride: 99 mmol/L (ref 98–111)
Creatinine, Ser: <0.3 mg/dL — ABNORMAL LOW (ref 0.61–1.24)
Glucose, Bld: 106 mg/dL — ABNORMAL HIGH (ref 70–99)
Potassium: 3 mmol/L — ABNORMAL LOW (ref 3.5–5.1)
Sodium: 131 mmol/L — ABNORMAL LOW (ref 135–145)
Total Bilirubin: 22.3 mg/dL (ref <1.2)
Total Protein: 6 g/dL — ABNORMAL LOW (ref 6.5–8.1)

## 2023-09-11 LAB — CBC WITH DIFFERENTIAL/PLATELET
Abs Immature Granulocytes: 0.19 10*3/uL — ABNORMAL HIGH (ref 0.00–0.07)
Basophils Absolute: 0.1 10*3/uL (ref 0.0–0.1)
Basophils Relative: 1 %
Eosinophils Absolute: 0.1 10*3/uL (ref 0.0–0.5)
Eosinophils Relative: 1 %
HCT: 33.3 % — ABNORMAL LOW (ref 39.0–52.0)
Hemoglobin: 11.5 g/dL — ABNORMAL LOW (ref 13.0–17.0)
Immature Granulocytes: 1 %
Lymphocytes Relative: 18 %
Lymphs Abs: 2.4 10*3/uL (ref 0.7–4.0)
MCH: 35.1 pg — ABNORMAL HIGH (ref 26.0–34.0)
MCHC: 34.5 g/dL (ref 30.0–36.0)
MCV: 101.5 fL — ABNORMAL HIGH (ref 80.0–100.0)
Monocytes Absolute: 1.6 10*3/uL — ABNORMAL HIGH (ref 0.1–1.0)
Monocytes Relative: 12 %
Neutro Abs: 8.9 10*3/uL — ABNORMAL HIGH (ref 1.7–7.7)
Neutrophils Relative %: 67 %
Platelets: 229 10*3/uL (ref 150–400)
RBC: 3.28 MIL/uL — ABNORMAL LOW (ref 4.22–5.81)
RDW: 21.4 % — ABNORMAL HIGH (ref 11.5–15.5)
WBC: 13.2 10*3/uL — ABNORMAL HIGH (ref 4.0–10.5)
nRBC: 0 % (ref 0.0–0.2)

## 2023-09-11 LAB — HIV ANTIBODY (ROUTINE TESTING W REFLEX): HIV Screen 4th Generation wRfx: NONREACTIVE

## 2023-09-11 LAB — URINE CULTURE: Culture: 10000 — AB

## 2023-09-11 LAB — CERULOPLASMIN: Ceruloplasmin: 25.6 mg/dL (ref 16.0–31.0)

## 2023-09-11 LAB — ANTI-SMOOTH MUSCLE ANTIBODY, IGG: F-Actin IgG: 8 U (ref 0–19)

## 2023-09-11 LAB — ALPHA-1-ANTITRYPSIN: A-1 Antitrypsin, Ser: 198 mg/dL — ABNORMAL HIGH (ref 95–164)

## 2023-09-11 LAB — MAGNESIUM: Magnesium: 2.2 mg/dL (ref 1.7–2.4)

## 2023-09-11 MED ORDER — POTASSIUM CHLORIDE CRYS ER 20 MEQ PO TBCR
40.0000 meq | EXTENDED_RELEASE_TABLET | ORAL | Status: AC
  Administered 2023-09-11 (×2): 40 meq via ORAL
  Filled 2023-09-11 (×2): qty 2

## 2023-09-11 NOTE — Plan of Care (Signed)
  Problem: Education: Goal: Knowledge of General Education information will improve Description Including pain rating scale, medication(s)/side effects and non-pharmacologic comfort measures Outcome: Progressing   

## 2023-09-11 NOTE — Progress Notes (Signed)
[facility] Progress Note  Thomas Wyatt 27 y.o. [birth year]  CC: Jaundice, alcoholic hepatitis and cirrhosis   Subjective: Patient seen and examined at bedside.  Denies any acute issues.  Had a regular bowel movement this morning without any blood in the stool or black stool.  Denies nausea or vomiting.  ROS : Afebrile, negative for chest pain   Objective: Vital signs in last 24 hours: Vitals:   09/10/23 2247 09/11/23 0454  BP: 113/82 106/72  Pulse: 89 80  Resp: 18 18  Temp: 98.3 F (36.8 C) 98.2 F (36.8 C)  SpO2: 98% 98%    Physical Exam:  General:  Alert, cooperative, no distress, appears stated age  Head:  Normocephalic, without obvious abnormality, atraumatic  Eyes:  , EOM's intact, scleral icterus noted  Lungs:   No visible respiratory distress  Heart:  Regular rate and rhythm, S1, S2 normal  Abdomen:   Soft, non-tender, bowel sounds active all four quadrants,  no masses,   Extremities: Extremities normal, atraumatic, no  edema  Pulses: 2+ and symmetric    Lab Results: Recent Labs    09/10/23 0835 09/11/23 0526  NA 132* 131*  K 2.6* 3.0*  CL 99 99  CO2 23 23  GLUCOSE 79 106*  BUN <5* 5*  CREATININE <0.30* <0.30*  CALCIUM 7.7* 8.0*  MG 2.1 2.2  PHOS 2.7 1.9*   Recent Labs    09/10/23 0835 09/11/23 0526  AST 127* 122*  ALT 60* 62*  ALKPHOS 139* 140*  BILITOT 23.7* 22.3*  PROT 5.6* 6.0*  ALBUMIN 2.0* 2.1*   Recent Labs    09/10/23 0835 09/11/23 0526  WBC 11.3* 13.2*  NEUTROABS 7.4 8.9*  HGB 12.3* 11.5*  HCT 34.3* 33.3*  MCV 99.1 101.5*  PLT 234 229   Recent Labs    09/10/23 0835 09/11/23 0526  LABPROT 17.0* 17.0*  INR 1.4* 1.4*      Assessment/Plan: -Acute onset of jaundice in a patient with excessive alcohol use for last 5 years.  Surprisingly, CT scan showing findings concerning for cirrhosis and portal hypertension.  Given acuity of onset, this is most likely alcoholic hepatitis rather than decompensated cirrhosis.   Discriminant function score of 32.  MELD 3.0 - 27 as of November 12 -Alcohol use   Recommendations ------------------------- -Patient with discriminant function score of 32.  He likely has alcoholic hepatitis and I think it is reasonable to try prednisolone given his younger age.  He is afebrile.  No evidence of obvious infection.  No significant ascites to tap. -Started on prednisone on admission yesterday which was subsequently switched to  prednisolone 40 mg.  Minimal improvement in T. bili today.  -Normal ceruloplasmin, negative hepatitis panel, mildly elevated alpha-1 antitrypsin level which is nonspecific, normal ferritin. -Anti-smooth muscle antibody pending.  We will add antimitochondrial antibody as well as iron saturation.  -Absolute alcohol abstinence discussed with the patient.  Diagnosis of cirrhosis with portal hypertension also discussed.  He will need outpatient workup for his cirrhosis once acute issues are resolved.   - GI will follow      Kathi Der MD, FACP 09/11/2023, 8:24 AM  Contact #  [phone]

## 2023-09-11 NOTE — Progress Notes (Signed)
PROGRESS NOTE  Thomas Wyatt:[id number] DOB: 07-26-1996   PCP: Sheliah Hatch, PA-C  Patient is from: Home  DOA: 09/09/2023 LOS: 1  Chief complaints No chief complaint on file.    Brief Narrative / Interim history: 27 year old M with PMH of heavy alcohol use and tobacco use disorder presented to ED with worsening jaundice for about 10 days following nausea and vomiting for 2 weeks.  Recently seen by PCP and had lab drawn for jaundice that showed bilirubin of 26.2 and hyperkalemia of 6.2.  Reportedly drinks about 6-8 bottle of vodka but weaned himself down over the last 10 days.  Last drink was 3 days prior to presentation.  He denies prior history of withdrawal symptoms.  In ED, stable vitals except for mild tachycardia.  Mildly elevated AST and ALT with pattern consistent with alcohol.  Total bili elevated to 29.7.   CT abdomen and pelvis showed hepatomegaly and hepatic steatosis with cirrhosis and esophageal and paraesophageal varices.  RUQ [place] negative for cholelithiasis.  GI consulted and patient was admitted for further evaluation.  Subjective:  Seen and examined earlier this morning.  No major events overnight of this morning.  No complaints.  Objective: Vitals:   09/10/23 1539 09/10/23 1846 09/10/23 2247 09/11/23 0454  BP: 115/76 115/83 113/82 106/72  Pulse: 87 89 89 80  Resp: (!) 22 20 18 18   Temp: 98.4 F (36.9 C) 98.2 F (36.8 C) 98.3 F (36.8 C) 98.2 F (36.8 C)  TempSrc: Oral Oral Oral Oral  SpO2: 96% 97% 98% 98%  Weight:      Height:        Examination:  GENERAL: No apparent distress.  Nontoxic. HEENT: MMM.  Sclera icteric. NECK: Supple.  No apparent JVD.  RESP:  No IWOB.  Fair aeration bilaterally. CVS:  RRR. Heart sounds normal.  ABD/GI/GU: BS+. Abd soft, NTND.  MSK/EXT:  Moves extremities. No apparent deformity. No edema.  SKIN: Skin jaundice. NEURO: Awake, alert and oriented appropriately.  No apparent focal neuro deficit. PSYCH: Calm. Normal  affect.   Procedures:  None  Microbiology summarized: Urine culture with multiple species.  Assessment and plan: Alcoholic hepatitis/hyperbilirubinemia/jaundice-heavy alcohol use history.  Reports drinking 6-8 bottles of vodka a day.  Bilirubin was 29.7 on 11/11.  Mildly elevated AST and ALT with pattern consistent with alcohol.  Mild coagulopathy with INR of 1.3.  Ammonia 64 with Maddrey's score of 33.4. As Maddrey's >32 treatment with corticosteroids is indicated.  CT abdomen and pelvis suggested hepatic steatosis with liver cirrhosis, portal HTN and EV.  RUQ [place] negative for cholelithiasis.  Acute hepatitis panel, HIV, A1A, ferritin, ceruloplasmin and Tylenol level unrevealing, -[facility] following-start of prednisolone 40 mg daily -Follow ASMA -Continue p.o. 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 -Continue nadolol -Continue PPI  Alcohol use disorder: No withdrawal symptoms.  Last drink 3 days prior to presentation.  Has been slowly weaning himself of alcohol before that -Continue CIWA with as needed Ativan -Continue thiamine, folic acid and multivitamin -Encouraged alcohol cessation -TOC consulted   Hypokalemia -Monitor replenish as appropriate  Hyponatremia: Likely due to alcohol.  Mild. -Monitor  Tobacco Use Disorder: Smokes about 7 to 8 cigarettes a day. -Encouraged smoking cessation -Continue nicotine patch Body mass index is 18.88 kg/m.          DVT prophylaxis:  SCDs Start: 09/10/23 5366  Code Status: Full code Family Communication: None at bedside Level of care: Telemetry  Status is: Inpatient Remains inpatient appropriate because: Acute alcoholic hepatitis with hyperbilirubinemia   Final disposition: Home Consultants:  Gastroenterology  55 minutes with more than 50% spent in reviewing records, counseling patient/family and coordinating care.   Sch Meds:  Scheduled Meds:   folic acid  1 mg Oral Daily   multivitamin with minerals  1 tablet Oral Daily   nadolol  20 mg Oral Daily   nicotine  7 mg Transdermal Daily   pantoprazole  40 mg Oral Daily   potassium chloride  40 mEq Oral Q4H   prednisoLONE  40 mg Oral Daily   thiamine  100 mg Oral Daily   Or   thiamine  100 mg Intravenous Daily   Continuous Infusions: PRN Meds:.LORazepam **OR** LORazepam, melatonin, ondansetron **OR** ondansetron (ZOFRAN) IV  Antimicrobials: Anti-infectives (From admission, onward)    None        I have personally reviewed the following labs and images: CBC: Recent Labs  Lab 09/09/23 2040 09/10/23 0835 09/11/23 0526  WBC 14.2* 11.3* 13.2*  NEUTROABS 10.2* 7.4 8.9*  HGB 13.3 12.3* 11.5*  HCT 36.6* 34.3* 33.3*  MCV 99.7 99.1 101.5*  PLT 245 234 229   BMP &GFR Recent Labs  Lab 09/09/23 2040 09/09/23 2146 09/10/23 0835 09/11/23 0526  NA 130*  --  132* 131*  K 2.4*  --  2.6* 3.0*  CL 92*  --  99 99  CO2 25  --  23 23  GLUCOSE 109*  --  79 106*  BUN <5*  --  <5* 5*  CREATININE 0.49*  --  <0.30* <0.30*  CALCIUM 8.4*  --  7.7* 8.0*  MG  --  2.2 2.1 2.2  PHOS  --   --  2.7 1.9*   CrCl cannot be calculated (This lab value cannot be used to calculate CrCl because it is not a number: <0.30). Liver & Pancreas: Recent Labs  Lab 09/09/23 2040 09/10/23 0835 09/11/23 0526  AST 157* 127* 122*  ALT 75* 60* 62*  ALKPHOS 187* 139* 140*  BILITOT 29.7* 23.7* 22.3*  PROT 7.1 5.6* 6.0*  ALBUMIN 2.6* 2.0* 2.1*   Recent Labs  Lab 09/09/23 2040 09/10/23 0835  LIPASE 72* 54*   Recent Labs  Lab 09/09/23 2040 09/10/23 0835  AMMONIA 64* 39*   Diabetic: No results for input(s): "HGBA1C" in the last 72 hours. No results for input(s): "GLUCAP" in the last 168 hours. Cardiac Enzymes: No results for input(s): "CKTOTAL", "CKMB", "CKMBINDEX", "TROPONINI" in the last 168 hours. No results for input(s): "PROBNP" in the last 8760 hours. Coagulation Profile: Recent  Labs  Lab 09/09/23 2040 09/10/23 0835 09/11/23 0526  INR 1.3* 1.4* 1.4*   Thyroid Function Tests: No results for input(s): "TSH", "T4TOTAL", "FREET4", "T3FREE", "THYROIDAB" in the last 72 hours. Lipid Profile: No results for input(s): "CHOL", "HDL", "LDLCALC", "TRIG", "CHOLHDL", "LDLDIRECT" in the last 72 hours. Anemia Panel: Recent Labs    09/10/23 0459  FERRITIN 245   Urine analysis:    Component Value Date/Time   COLORURINE AMBER (A) 09/10/2023 0759   APPEARANCEUR HAZY (A) 09/10/2023 0759   LABSPEC >1.046 (H) 09/10/2023 0759   PHURINE 6.0 09/10/2023 0759   GLUCOSEU NEGATIVE 09/10/2023 0759   HGBUR NEGATIVE 09/10/2023 0759   BILIRUBINUR MODERATE (A) 09/10/2023 0759   KETONESUR 5 (A) 09/10/2023 0759   PROTEINUR NEGATIVE 09/10/2023 0759   NITRITE NEGATIVE 09/10/2023 0759   LEUKOCYTESUR NEGATIVE 09/10/2023 0759   Sepsis Labs: Invalid input(s): "PROCALCITONIN", "LACTICIDVEN"  Microbiology: Recent  Results (from the past 240 hour(s))  Urine Culture (for pregnant, neutropenic or urologic patients or patients with an indwelling urinary catheter)     Status: Abnormal   Collection Time: 09/10/23  7:59 AM   Specimen: Urine, Clean Catch  Result Value Ref Range Status   Specimen Description   Final    URINE, CLEAN CATCH Performed at [facility], [address] [place], [place] [address]    Special Requests   Final    NONE Performed at [facility], [address], [place], [place] [address]    Culture (A)  Final    10,000 COLONIES/mL MULTIPLE SPECIES PRESENT, SUGGEST RECOLLECTION   Report Status 09/11/2023 FINAL  Final    Radiology Studies: No results found.    Lucy Boardman T. Selina Tapper [facility]  If 7PM-7AM, please contact night-coverage [email or web address] 09/11/2023, 12:39 PM

## 2023-09-11 NOTE — Plan of Care (Signed)

## 2023-09-12 ENCOUNTER — Encounter (HOSPITAL_COMMUNITY): Payer: Self-pay | Admitting: Internal Medicine

## 2023-09-12 DIAGNOSIS — R17 Unspecified jaundice: Secondary | ICD-10-CM

## 2023-09-12 DIAGNOSIS — R7401 Elevation of levels of liver transaminase levels: Secondary | ICD-10-CM | POA: Diagnosis not present

## 2023-09-12 DIAGNOSIS — E876 Hypokalemia: Secondary | ICD-10-CM | POA: Diagnosis not present

## 2023-09-12 DIAGNOSIS — F17200 Nicotine dependence, unspecified, uncomplicated: Secondary | ICD-10-CM | POA: Diagnosis not present

## 2023-09-12 LAB — COMPREHENSIVE METABOLIC PANEL
ALT: 68 U/L — ABNORMAL HIGH (ref 0–44)
AST: 117 U/L — ABNORMAL HIGH (ref 15–41)
Albumin: 2.1 g/dL — ABNORMAL LOW (ref 3.5–5.0)
Alkaline Phosphatase: 148 U/L — ABNORMAL HIGH (ref 38–126)
Anion gap: 7 (ref 5–15)
BUN: 7 mg/dL (ref 6–20)
CO2: 26 mmol/L (ref 22–32)
Calcium: 8.1 mg/dL — ABNORMAL LOW (ref 8.9–10.3)
Chloride: 101 mmol/L (ref 98–111)
Creatinine, Ser: 0.43 mg/dL — ABNORMAL LOW (ref 0.61–1.24)
GFR, Estimated: >60 mL/min (ref >60)
Glucose, Bld: 84 mg/dL (ref 70–99)
Potassium: 3.2 mmol/L — ABNORMAL LOW (ref 3.5–5.1)
Sodium: 134 mmol/L — ABNORMAL LOW (ref 135–145)
Total Bilirubin: 19.8 mg/dL (ref <1.2)
Total Protein: 6 g/dL — ABNORMAL LOW (ref 6.5–8.1)

## 2023-09-12 LAB — PROTIME-INR
INR: 1.3 — ABNORMAL HIGH (ref 0.8–1.2)
Prothrombin Time: 16.2 s — ABNORMAL HIGH (ref 11.4–15.2)

## 2023-09-12 LAB — IRON AND TIBC
Iron: 142 ug/dL (ref 45–182)
Saturation Ratios: 81 % — ABNORMAL HIGH (ref 17.9–39.5)
TIBC: 175 ug/dL — ABNORMAL LOW (ref 250–450)
UIBC: 33 ug/dL

## 2023-09-12 LAB — CBC
HCT: 35.3 % — ABNORMAL LOW (ref 39.0–52.0)
Hemoglobin: 12.2 g/dL — ABNORMAL LOW (ref 13.0–17.0)
MCH: 35.8 pg — ABNORMAL HIGH (ref 26.0–34.0)
MCHC: 34.6 g/dL (ref 30.0–36.0)
MCV: 103.5 fL — ABNORMAL HIGH (ref 80.0–100.0)
Platelets: 238 10*3/uL (ref 150–400)
RBC: 3.41 MIL/uL — ABNORMAL LOW (ref 4.22–5.81)
RDW: 21.8 % — ABNORMAL HIGH (ref 11.5–15.5)
WBC: 14.4 10*3/uL — ABNORMAL HIGH (ref 4.0–10.5)
nRBC: 0 % (ref 0.0–0.2)

## 2023-09-12 LAB — MAGNESIUM: Magnesium: 2.6 mg/dL — ABNORMAL HIGH (ref 1.7–2.4)

## 2023-09-12 MED ORDER — POTASSIUM CHLORIDE CRYS ER 20 MEQ PO TBCR
40.0000 meq | EXTENDED_RELEASE_TABLET | ORAL | Status: AC
  Administered 2023-09-12 (×2): 40 meq via ORAL
  Filled 2023-09-12 (×2): qty 2

## 2023-09-12 NOTE — Progress Notes (Signed)
[facility] Progress Note  Thomas Wyatt 27 y.o. Mar 25, 1996  CC: Jaundice, alcoholic hepatitis and cirrhosis   Subjective: Patient seen and examined at bedside.  Denies any acute issues.  Appetite has improved.  ROS : Afebrile, negative for chest pain   Objective: Vital signs in last 24 hours: Vitals:   09/11/23 2037 09/12/23 0647  BP: 105/70 106/70  Pulse: 74 77  Resp: 18 20  Temp: 98.3 F (36.8 C) (!) 97.5 F (36.4 C)  SpO2: 99% 99%    Physical Exam:  General:  Alert, cooperative, no distress, appears stated age  Head:  Normocephalic, without obvious abnormality, atraumatic  Eyes:  , EOM's intact, scleral icterus noted  Lungs:   No visible respiratory distress  Heart:  Regular rate and rhythm, S1, S2 normal  Abdomen:   Soft, non-tender, bowel sounds active all four quadrants,  no masses,   Extremities: Extremities normal, atraumatic, no  edema  Pulses: 2+ and symmetric    Lab Results: Recent Labs    09/10/23 0835 09/11/23 0526 09/12/23 0547  NA 132* 131* 134*  K 2.6* 3.0* 3.2*  CL 99 99 101  CO2 23 23 26   GLUCOSE 79 106* 84  BUN <5* 5* 7  CREATININE <0.30* <0.30* 0.43*  CALCIUM 7.7* 8.0* 8.1*  MG 2.1 2.2 2.6*  PHOS 2.7 1.9*  --    Recent Labs    09/11/23 0526 09/12/23 0547  AST 122* 117*  ALT 62* 68*  ALKPHOS 140* 148*  BILITOT 22.3* 19.8*  PROT 6.0* 6.0*  ALBUMIN 2.1* 2.1*   Recent Labs    09/10/23 0835 09/11/23 0526 09/12/23 0547  WBC 11.3* 13.2* 14.4*  NEUTROABS 7.4 8.9*  --   HGB 12.3* 11.5* 12.2*  HCT 34.3* 33.3* 35.3*  MCV 99.1 101.5* 103.5*  PLT 234 229 238   Recent Labs    09/11/23 0526 09/12/23 0547  LABPROT 17.0* 16.2*  INR 1.4* 1.3*      Assessment/Plan: -Acute onset of jaundice in a patient with excessive alcohol use for last 5 years.  Surprisingly, CT scan showing findings concerning for cirrhosis and portal hypertension.  Given acuity of onset, this is most likely alcoholic hepatitis rather than  decompensated cirrhosis.  Discriminant function score of 32.  MELD 3.0 - 27 as of November 12 -Alcohol use   Recommendations ------------------------- -Patient with discriminant function score of 32.  He likely has alcoholic hepatitis and I think it is reasonable to try prednisolone given his younger age.  He is afebrile.  No evidence of obvious infection.  No significant ascites to tap. -Started on prednisone on admission yesterday which was subsequently switched to  prednisolone 40 mg.  Minimal improvement in T. bili today.  -Normal ceruloplasmin, negative hepatitis panel, mildly elevated alpha-1 antitrypsin level which is nonspecific, normal ferritin but elevated iron saturation. -Normal Anti-smooth muscle antibody  -  antimitochondrial antibody pending  -Absolute alcohol abstinence discussed with the patient.  Diagnosis of cirrhosis with portal hypertension also discussed.  He will need outpatient workup for his cirrhosis once acute issues are resolved.   - GI will follow .  Hopefully discharge home tomorrow.     Kathi Der MD, FACP 09/12/2023, 12:11 PM  Contact #  [phone]

## 2023-09-12 NOTE — Plan of Care (Signed)
  Problem: Education: Goal: Knowledge of General Education information will improve Description: Including pain rating scale, medication(s)/side effects and non-pharmacologic comfort measures Outcome: Progressing   Problem: Clinical Measurements: Goal: Ability to maintain clinical measurements within normal limits will improve Outcome: Progressing   Problem: Skin Integrity: Goal: Risk for impaired skin integrity will decrease Outcome: Progressing   

## 2023-09-12 NOTE — Progress Notes (Signed)
PROGRESS NOTE  Thomas Wyatt:[id number] DOB: 10-29-1996   PCP: Sheliah Hatch, PA-C  Patient is from: Home  DOA: 09/09/2023 LOS: 2  Chief complaints No chief complaint on file.    Brief Narrative / Interim history: 27 year old M with PMH of heavy alcohol use and tobacco use disorder presented to ED with worsening jaundice for about 10 days following nausea and vomiting for 2 weeks.  Recently seen by PCP and had lab drawn for jaundice that showed bilirubin of 26.2 and hyperkalemia of 6.2.  Reportedly drinks about 6-8 bottle of vodka but weaned himself down over the last 10 days.  Last drink was 3 days prior to presentation.  He denies prior history of withdrawal symptoms.  In ED, stable vitals except for mild tachycardia.  Mildly elevated AST and ALT with pattern consistent with alcohol.  Total bili elevated to 29.7.   CT abdomen and pelvis showed hepatomegaly and hepatic steatosis with cirrhosis and esophageal and paraesophageal varices.  RUQ [place] negative for cholelithiasis.  GI consulted and patient was admitted for further evaluation.  Subjective:  Seen and examined earlier this morning.  No major events overnight of this morning.  No complaints.  Some improvement in bilirubin.  Objective: Vitals:   09/11/23 0454 09/11/23 1319 09/11/23 2037 09/12/23 0647  BP: 106/72 102/75 105/70 106/70  Pulse: 80 82 74 77  Resp: 18 20 18 20   Temp: 98.2 F (36.8 C) 97.9 F (36.6 C) 98.3 F (36.8 C) (!) 97.5 F (36.4 C)  TempSrc: Oral Oral Oral Oral  SpO2: 98% 96% 99% 99%  Weight:      Height:        Examination:  GENERAL: No apparent distress.  Nontoxic. HEENT: MMM.  Sclera icteric. NECK: Supple.  No apparent JVD.  RESP:  No IWOB.  Fair aeration bilaterally. CVS:  RRR. Heart sounds normal.  ABD/GI/GU: BS+. Abd soft, NTND.  MSK/EXT:  Moves extremities. No apparent deformity. No edema.  SKIN: Skin jaundice. NEURO: Awake, alert and oriented appropriately.  No apparent focal  neuro deficit. PSYCH: Calm. Normal affect.   Procedures:  None  Microbiology summarized: Urine culture with multiple species.  Assessment and plan: Alcoholic hepatitis/hyperbilirubinemia/jaundice-heavy alcohol use history.  Reports drinking 6-8 bottles of vodka a day.  Bilirubin was 29.7 on 11/11.  Mildly elevated AST and ALT with pattern consistent with alcohol.  Mild coagulopathy with INR of 1.3.  Ammonia 64 with Maddrey's score of 33.4. As Maddrey's >32 treatment with corticosteroids is indicated.  CT abdomen and pelvis suggested hepatic steatosis with liver cirrhosis, portal HTN and EV.  RUQ [place] negative for cholelithiasis.  Acute hepatitis panel, HIV, A1A, ASMA, ferritin, ceruloplasmin and Tylenol level unrevealing.  Slowly improving. -[facility] following-start of prednisolone 40 mg daily -Continue p.o. 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 -Continue nadolol -Continue PPI  Alcohol use disorder: No withdrawal symptoms.  Last drink 3 days prior to presentation.  Has been slowly weaning himself of alcohol before that -Continue CIWA with as needed Ativan -Continue thiamine, folic acid and multivitamin -Encouraged alcohol cessation -TOC consulted   Tobacco Use Disorder: Smokes about 7 to 8 cigarettes a day. -Encouraged smoking cessation -Continue nicotine patch  Hypokalemia -Monitor replenish as appropriate  Hyponatremia: Likely due to alcohol.  Mild. -Monitor  Leukocytosis: Likely demargination from steroid  Body mass index is 18.88 kg/m.          DVT prophylaxis:  SCDs Start: 09/10/23 [phone]  Code Status: Full code Family Communication: None at bedside Level of care: Med-Surg Status is: Inpatient Remains inpatient appropriate because: Acute alcoholic hepatitis with hyperbilirubinemia   Final disposition: Home Consultants:  Gastroenterology  35 minutes with more than 50% spent in  reviewing records, counseling patient/family and coordinating care.   Sch Meds:  Scheduled Meds:  folic acid  1 mg Oral Daily   multivitamin with minerals  1 tablet Oral Daily   nadolol  20 mg Oral Daily   nicotine  7 mg Transdermal Daily   pantoprazole  40 mg Oral Daily   prednisoLONE  40 mg Oral Daily   thiamine  100 mg Oral Daily   Or   thiamine  100 mg Intravenous Daily   Continuous Infusions: PRN Meds:.LORazepam **OR** LORazepam, melatonin, ondansetron **OR** ondansetron (ZOFRAN) IV  Antimicrobials: Anti-infectives (From admission, onward)    None        I have personally reviewed the following labs and images: CBC: Recent Labs  Lab 09/09/23 2040 09/10/23 0835 09/11/23 0526 09/12/23 0547  WBC 14.2* 11.3* 13.2* 14.4*  NEUTROABS 10.2* 7.4 8.9*  --   HGB 13.3 12.3* 11.5* 12.2*  HCT 36.6* 34.3* 33.3* 35.3*  MCV 99.7 99.1 101.5* 103.5*  PLT 245 234 229 238   BMP &GFR Recent Labs  Lab 09/09/23 2040 09/09/23 2146 09/10/23 0835 09/11/23 0526 09/12/23 0547  NA 130*  --  132* 131* 134*  K 2.4*  --  2.6* 3.0* 3.2*  CL 92*  --  99 99 101  CO2 25  --  23 23 26   GLUCOSE 109*  --  79 106* 84  BUN <5*  --  <5* 5* 7  CREATININE 0.49*  --  <0.30* <0.30* 0.43*  CALCIUM 8.4*  --  7.7* 8.0* 8.1*  MG  --  2.2 2.1 2.2 2.6*  PHOS  --   --  2.7 1.9*  --    Estimated Creatinine Clearance: 109.5 mL/min (A) (by C-G formula based on SCr of 0.43 mg/dL (L)). Liver & Pancreas: Recent Labs  Lab 09/09/23 2040 09/10/23 0835 09/11/23 0526 09/12/23 0547  AST 157* 127* 122* 117*  ALT 75* 60* 62* 68*  ALKPHOS 187* 139* 140* 148*  BILITOT 29.7* 23.7* 22.3* 19.8*  PROT 7.1 5.6* 6.0* 6.0*  ALBUMIN 2.6* 2.0* 2.1* 2.1*   Recent Labs  Lab 09/09/23 2040 09/10/23 0835  LIPASE 72* 54*   Recent Labs  Lab 09/09/23 2040 09/10/23 0835  AMMONIA 64* 39*   Diabetic: No results for input(s): "HGBA1C" in the last 72 hours. No results for input(s): "GLUCAP" in the last 168  hours. Cardiac Enzymes: No results for input(s): "CKTOTAL", "CKMB", "CKMBINDEX", "TROPONINI" in the last 168 hours. No results for input(s): "PROBNP" in the last 8760 hours. Coagulation Profile: Recent Labs  Lab 09/09/23 2040 09/10/23 0835 09/11/23 0526 09/12/23 0547  INR 1.3* 1.4* 1.4* 1.3*   Thyroid Function Tests: No results for input(s): "TSH", "T4TOTAL", "FREET4", "T3FREE", "THYROIDAB" in the last 72 hours. Lipid Profile: No results for input(s): "CHOL", "HDL", "LDLCALC", "TRIG", "CHOLHDL", "LDLDIRECT" in the last 72 hours. Anemia Panel: Recent Labs    09/10/23 0459 09/12/23 0547  FERRITIN 245  --   TIBC  --  175*  IRON  --  142   Urine analysis:    Component Value Date/Time   COLORURINE AMBER (A) 09/10/2023 0759   APPEARANCEUR HAZY (A) 09/10/2023 0759   LABSPEC >1.046 (H) 09/10/2023 0759   PHURINE 6.0 09/10/2023 0759   GLUCOSEU  NEGATIVE 09/10/2023 0759   HGBUR NEGATIVE 09/10/2023 0759   BILIRUBINUR MODERATE (A) 09/10/2023 0759   KETONESUR 5 (A) 09/10/2023 0759   PROTEINUR NEGATIVE 09/10/2023 0759   NITRITE NEGATIVE 09/10/2023 0759   LEUKOCYTESUR NEGATIVE 09/10/2023 0759   Sepsis Labs: Invalid input(s): "PROCALCITONIN", "LACTICIDVEN"  Microbiology: Recent Results (from the past 240 hour(s))  Urine Culture (for pregnant, neutropenic or urologic patients or patients with an indwelling urinary catheter)     Status: Abnormal   Collection Time: 09/10/23  7:59 AM   Specimen: Urine, Clean Catch  Result Value Ref Range Status   Specimen Description   Final    URINE, CLEAN CATCH Performed at [facility], [address], [place], [place] [address]    Special Requests   Final    NONE Performed at [facility], [address] [place], [place] [address]    Culture (A)  Final    10,000 COLONIES/mL MULTIPLE SPECIES PRESENT, SUGGEST RECOLLECTION   Report Status 09/11/2023 FINAL  Final    Radiology Studies: No results  found.    Hazelene Doten T. Lauris Serviss [facility]  If 7PM-7AM, please contact night-coverage [email or web address] 09/12/2023, 1:19 PM

## 2023-09-12 NOTE — Plan of Care (Signed)

## 2023-09-13 DIAGNOSIS — K7030 Alcoholic cirrhosis of liver without ascites: Secondary | ICD-10-CM | POA: Diagnosis not present

## 2023-09-13 DIAGNOSIS — R7401 Elevation of levels of liver transaminase levels: Secondary | ICD-10-CM | POA: Diagnosis not present

## 2023-09-13 DIAGNOSIS — K766 Portal hypertension: Secondary | ICD-10-CM | POA: Diagnosis not present

## 2023-09-13 DIAGNOSIS — F17200 Nicotine dependence, unspecified, uncomplicated: Secondary | ICD-10-CM | POA: Diagnosis not present

## 2023-09-13 DIAGNOSIS — I8510 Secondary esophageal varices without bleeding: Secondary | ICD-10-CM

## 2023-09-13 LAB — COMPREHENSIVE METABOLIC PANEL
ALT: 65 U/L — ABNORMAL HIGH (ref 0–44)
AST: 107 U/L — ABNORMAL HIGH (ref 15–41)
Albumin: 1.9 g/dL — ABNORMAL LOW (ref 3.5–5.0)
Alkaline Phosphatase: 123 U/L (ref 38–126)
Anion gap: 7 (ref 5–15)
BUN: 11 mg/dL (ref 6–20)
CO2: 21 mmol/L — ABNORMAL LOW (ref 22–32)
Calcium: 8.1 mg/dL — ABNORMAL LOW (ref 8.9–10.3)
Chloride: 105 mmol/L (ref 98–111)
Creatinine, Ser: 0.33 mg/dL — ABNORMAL LOW (ref 0.61–1.24)
GFR, Estimated: >60 mL/min (ref >60)
Glucose, Bld: 95 mg/dL (ref 70–99)
Potassium: 4.1 mmol/L (ref 3.5–5.1)
Sodium: 133 mmol/L — ABNORMAL LOW (ref 135–145)
Total Bilirubin: 16.2 mg/dL — ABNORMAL HIGH (ref <1.2)
Total Protein: 5.4 g/dL — ABNORMAL LOW (ref 6.5–8.1)

## 2023-09-13 LAB — PROTIME-INR
INR: 1.3 — ABNORMAL HIGH (ref 0.8–1.2)
Prothrombin Time: 16.3 s — ABNORMAL HIGH (ref 11.4–15.2)

## 2023-09-13 LAB — CBC
HCT: 34.4 % — ABNORMAL LOW (ref 39.0–52.0)
Hemoglobin: 11.7 g/dL — ABNORMAL LOW (ref 13.0–17.0)
MCH: 35.3 pg — ABNORMAL HIGH (ref 26.0–34.0)
MCHC: 34 g/dL (ref 30.0–36.0)
MCV: 103.9 fL — ABNORMAL HIGH (ref 80.0–100.0)
Platelets: 231 10*3/uL (ref 150–400)
RBC: 3.31 MIL/uL — ABNORMAL LOW (ref 4.22–5.81)
RDW: 21.6 % — ABNORMAL HIGH (ref 11.5–15.5)
WBC: 15.9 10*3/uL — ABNORMAL HIGH (ref 4.0–10.5)
nRBC: 0 % (ref 0.0–0.2)

## 2023-09-13 LAB — MAGNESIUM: Magnesium: 2.5 mg/dL — ABNORMAL HIGH (ref 1.7–2.4)

## 2023-09-13 LAB — MITOCHONDRIAL ANTIBODIES: Mitochondrial M2 Ab, IgG: <20 U (ref 0.0–20.0)

## 2023-09-13 LAB — PHOSPHORUS: Phosphorus: 3.3 mg/dL (ref 2.5–4.6)

## 2023-09-13 MED ORDER — ADULT MULTIVITAMIN W/MINERALS CH: 1.0000 | ORAL_TABLET | Freq: Every day | ORAL | Status: DC

## 2023-09-13 MED ORDER — FOLIC ACID 1 MG PO TABS: 1.0000 mg | ORAL_TABLET | Freq: Every day | ORAL | 1 refills | Status: DC

## 2023-09-13 MED ORDER — NADOLOL 20 MG PO TABS: 20.0000 mg | ORAL_TABLET | Freq: Every day | ORAL | 1 refills | Status: DC

## 2023-09-13 MED ORDER — PREDNISOLONE 5 MG PO TABS: 40.0000 mg | ORAL_TABLET | Freq: Every day | ORAL | 0 refills | Status: DC

## 2023-09-13 MED ORDER — PANTOPRAZOLE SODIUM 40 MG PO TBEC: 40.0000 mg | DELAYED_RELEASE_TABLET | Freq: Every day | ORAL | 1 refills | Status: DC

## 2023-09-13 MED ORDER — VITAMIN B-1 100 MG PO TABS: 100.0000 mg | ORAL_TABLET | Freq: Every day | ORAL | 1 refills | Status: DC

## 2023-09-13 NOTE — Discharge Summary (Signed)
Physician Discharge Summary  Thomas Wyatt VPX:[id number] DOB: Sep 11, 1996 DOA: 09/09/2023  PCP: Sheliah Hatch, PA-C  Admit date: 09/09/2023 Discharge date: 09/13/2023 Admitted From: Home Disposition: Home Recommendations for Outpatient Follow-up:  Outpatient follow-up with PCP in 1 week  GI to arrange outpatient follow-up in 1 to 2 weeks Check CMP and CBC at follow-up Please follow up on the following pending results: None  Home Health: Not indicated Equipment/Devices: Not indicated  Discharge Condition: Stable CODE STATUS: Full code  Follow-up Information     Sheliah Hatch, PA-C. Schedule an appointment as soon as possible for a visit in 1 week(s).   Specialty: Family Medicine Contact information: [address] [place] [address] [place] [address] [phone]                 Hospital course 27 year old M with PMH of heavy alcohol use and tobacco use disorder presented to ED with worsening jaundice for about 10 days following nausea and vomiting for 2 weeks.  Recently seen by PCP and had lab drawn for jaundice that showed bilirubin of 26.2 and hyperkalemia of 6.2.  Reportedly drinks about 6-8 bottle of vodka but weaned himself down over the last 10 days.  Last drink was 3 days prior to presentation.  He denies prior history of withdrawal symptoms.  In ED, stable vitals except for mild tachycardia.  Mildly elevated AST and ALT with pattern consistent with alcohol.  Total bili elevated to 29.7.    CT abdomen and pelvis showed hepatomegaly and hepatic steatosis with cirrhosis and esophageal and paraesophageal varices.  RUQ [place] negative for cholelithiasis.  GI consulted and patient was admitted for further evaluation.  Evaluated by GI.  Acute hepatitis panel, HIV, A1A, ASMA, ferritin, ceruloplasmin and Tylenol level unrevealing.  Started on prednisolone.  Bilirubin trended down to 16.2.  He was cleared for discharge by GI on p.o. prednisolone.  GI to arrange outpatient  follow-up in 1 to 2 weeks.  Emphasized the importance of not drinking alcohol or smoking cigarettes.  He will be on PPI for GI prophylaxis.  Also started on a low-dose given portal hypertension and esophageal varices.  See individual problem list below for more.   Problems addressed during this hospitalization Alcoholic hepatitis/hyperbilirubinemia/jaundice-heavy alcohol use history.  Reports drinking 6-8 bottles of vodka a day.  Bilirubin was 29.7 on 11/11.  Mildly elevated AST and ALT with pattern consistent with alcohol.  Mild coagulopathy with INR of 1.3.  Ammonia 64 with Maddrey's score of 33.4. As Maddrey's >32 treatment with corticosteroids is indicated.  CT abdomen and pelvis suggested hepatic steatosis with liver cirrhosis, portal HTN and EV.  RUQ [place] negative for cholelithiasis.  Acute hepatitis panel, HIV, A1A, ASMA, ferritin, ceruloplasmin and Tylenol level unrevealing.  Bilirubin trended from 29.7-16.2. -Discharge on p.o. prednisone 40 mg daily for a total of 28 days -P.o. Protonix for GI prophylaxis -GI to arrange outpatient follow-up -Counseled on the importance of alcohol cessation   Alcoholic liver cirrhosis/esophageal Varices/portal hypertension: No signs of ascites.  No withdrawal symptoms. -Continue nadolol and PPI.   Alcohol use disorder: No withdrawal symptoms.  Last drink 3 days prior to presentation.  Has been slowly weaning himself of alcohol before that.  No withdrawal symptoms. -Continue thiamine, multivitamin and folic acid -Counseled on the importance of alcohol cessation   Tobacco Use Disorder: Smokes about 7 to 8 cigarettes a day. -Encouraged smoking cessation.  Hypokalemia: Resolved   Hyponatremia: Likely due to alcohol.  Mild. -Recheck at  follow-up   Leukocytosis: Likely demargination from steroid            Time spent 35 minutes  Vital signs Vitals:   09/11/23 2037 09/12/23 0647 09/12/23 1507 09/13/23 0544  BP: 105/70 106/70 104/70 106/67   Pulse: 74 77 74 75  Temp: 98.3 F (36.8 C) (!) 97.5 F (36.4 C) 98 F (36.7 C) 98.4 F (36.9 C)  Resp: 18 20  20   Height:      Weight:      SpO2: 99% 99% 92% 98%  TempSrc: Oral Oral Oral Oral  BMI (Calculated):         Discharge exam  GENERAL: No apparent distress.  Nontoxic. HEENT: MMM.  Sclera icteric NECK: Supple.  No apparent JVD.  RESP:  No IWOB.  Fair aeration bilaterally. CVS:  RRR. Heart sounds normal.  ABD/GI/GU: BS+. Abd soft, NTND.  MSK/EXT:  Moves extremities. No apparent deformity. No edema.  SKIN: Skin jaundice. NEURO: Awake and alert. Oriented appropriately.  No apparent focal neuro deficit. PSYCH: Calm. Normal affect.   Discharge Instructions Discharge Instructions     Diet - low sodium heart healthy   Complete by: As directed    Discharge instructions   Complete by: As directed    It has been a pleasure taking care of you!  You were hospitalized due to elevated liver enzymes and bilirubin with jaundice likely from alcohol.  Your bilirubin improved with steroid.  We are discharging you on more steroid.  It is very important that you take your medications as prescribed.  It is also very important that you stop drinking alcohol and smoking cigarettes.  Please review your new medication list and the directions on your medications before you take them.  Follow-up with your primary care doctor in 1 to 2 weeks.  Follow-up with gastroenterologist per their recommendation.   Take care,   Increase activity slowly   Complete by: As directed       Allergies as of 09/13/2023   No Known Allergies      Medication List     STOP taking these medications    cyclobenzaprine 5 MG tablet Commonly known as: FLEXERIL   famotidine 20 MG tablet Commonly known as: PEPCID   naproxen 500 MG tablet Commonly known as: NAPROSYN   ondansetron 4 MG disintegrating tablet Commonly known as: ZOFRAN-ODT   vitamin B-12 100 MCG tablet Commonly known as:  CYANOCOBALAMIN       TAKE these medications    folic acid 1 MG tablet Commonly known as: FOLVITE Take 1 tablet (1 mg total) by mouth daily.   multivitamin with minerals Tabs tablet Take 1 tablet by mouth daily.   nadolol 20 MG tablet Commonly known as: CORGARD Take 1 tablet (20 mg total) by mouth daily.   pantoprazole 40 MG tablet Commonly known as: PROTONIX Take 1 tablet (40 mg total) by mouth daily.   prednisoLONE 5 MG Tabs tablet Take 8 tablets (40 mg total) by mouth daily.   thiamine 100 MG tablet Commonly known as: Vitamin B-1 Take 1 tablet (100 mg total) by mouth daily.        Consultations: Gastroenterology  Procedures/Studies:   DG Chest 2 View  Result Date: 09/10/2023 CLINICAL DATA:  Leukocytosis. EXAM: CHEST - 2 VIEW COMPARISON:  03/13/2022 FINDINGS: The heart size and mediastinal contours are within normal limits. Both lungs are clear. The visualized skeletal structures are unremarkable. IMPRESSION: No active cardiopulmonary disease. Electronically Signed   By: Mayra Neer  Eppie Gibson M.D.   On: 09/10/2023 08:52   US Abdomen Limited RUQ (LIVER/GB)  Result Date: 09/10/2023 CLINICAL DATA:  Generalized weakness. EXAM: ULTRASOUND ABDOMEN LIMITED RIGHT UPPER QUADRANT COMPARISON:  Abdominal CT from yesterday FINDINGS: Gallbladder: Edematous wall thickening of the gallbladder. No over distension or calculus. No focal tenderness. Common bile duct: Diameter: 4 mm. Liver: Echogenic and heterogeneous parenchyma in the setting of known cirrhosis. No gross lesion. Portal vein is patent on color Doppler imaging with normal direction of blood flow towards the liver. IMPRESSION: Gallbladder wall thickening considered reactive in the setting of chronic liver disease. No cholelithiasis. Electronically Signed   By: Tiburcio Pea M.D.   On: 09/10/2023 06:46   CT ABDOMEN PELVIS W CONTRAST  Result Date: 09/10/2023 CLINICAL DATA:  concern for biliary obstruction - evaluation for mass  Patient sent in by PCP due to abnormal labs. Patient has bloodwork done today. Critical labs per MD- Potassium 6.2 and Total Bilirubin 26.2 Patient jaundice on arrival EXAM: CT ABDOMEN AND PELVIS WITH CONTRAST TECHNIQUE: Multidetector CT imaging of the abdomen and pelvis was performed using the standard protocol following bolus administration of intravenous contrast. RADIATION DOSE REDUCTION: This exam was performed according to the departmental dose-optimization program which includes automated exposure control, adjustment of the mA and/or kV according to patient size and/or use of iterative reconstruction technique. CONTRAST:  OMNIPAQUE IOHEXOL 300 MG/ML  SOLN COMPARISON:  None Available. FINDINGS: Lower chest: No acute abnormality. Hepatobiliary: The liver is enlarged measuring up to 27 cm. The hepatic parenchyma is markedly heterogeneous and diffusely hypodense compared to the splenic parenchyma consistent with fatty infiltration. Enlarged caudate lobe. No focal liver abnormality. Contracted gallbladder. Contracted gallbladder with pericholecystic fluid in question gallbladder wall thickening. No biliary dilatation. Pancreas: No focal lesion. Normal pancreatic contour. No surrounding inflammatory changes. No main pancreatic ductal dilatation. Spleen: Enlarged in size measuring up to 14.5 cm.  No focal lesion. Adrenals/Urinary Tract: No adrenal nodule bilaterally. Bilateral kidneys enhance symmetrically. Punctate right nephrolithiasis. No hydronephrosis. No hydroureter. The urinary bladder is unremarkable. Stomach/Bowel: Stomach is within normal limits. No evidence of bowel wall thickening or dilatation. Colonic diverticulosis. Appendix appears normal. Vascular/Lymphatic: Paraesophageal and esophageal varices. Recanalized paraumbilical vein. No abdominal aorta or iliac aneurysm. Mild atherosclerotic plaque of the aorta and its branches. No abdominal, pelvic, or inguinal lymphadenopathy. Reproductive:  Prostate is unremarkable. Other: Trace to small volume simple free fluid. No intraperitoneal free gas. No organized fluid collection. Musculoskeletal: No abdominal wall hernia or abnormality. No suspicious lytic or blastic osseous lesions. No acute displaced fracture. IMPRESSION: 1. Hepatomegaly and hepatic steatosis with cirrhosis and portal hypertension. Heterogeneous hepatic parenchyma with limited evaluation for focal hepatic lesion on this single-phase study. Recommend MRI liver protocol for further evaluation. 2. Contracted gallbladder with pericholecystic fluid and question gallbladder wall thickening. Recommend right upper quadrant ultrasound for a more sensitive evaluation of the gallbladder. 3. Trace to small volume simple free fluid. 4. Colonic diverticulosis with no acute diverticulitis. 5. Nonobstructive punctate right nephrolithiasis. Electronically Signed   By: Tish Frederickson M.D.   On: 09/10/2023 00:37       The results of significant diagnostics from this hospitalization (including imaging, microbiology, ancillary and laboratory) are listed below for reference.     Microbiology: Recent Results (from the past 240 hour(s))  Urine Culture (for pregnant, neutropenic or urologic patients or patients with an indwelling urinary catheter)     Status: Abnormal   Collection Time: 09/10/23  7:59 AM   Specimen: Urine, Clean  Catch  Result Value Ref Range Status   Specimen Description   Final    URINE, CLEAN CATCH Performed at [facility], [address], [place], [place] [address]    Special Requests   Final    NONE Performed at [facility], [address], [place], [place] [address]    Culture (A)  Final    10,000 COLONIES/mL MULTIPLE SPECIES PRESENT, SUGGEST RECOLLECTION   Report Status 09/11/2023 FINAL  Final     Labs:  CBC: Recent Labs  Lab 09/09/23 2040 09/10/23 0835 09/11/23 0526 09/12/23 0547 09/13/23 0547  WBC 14.2* 11.3* 13.2*  14.4* 15.9*  NEUTROABS 10.2* 7.4 8.9*  --   --   HGB 13.3 12.3* 11.5* 12.2* 11.7*  HCT 36.6* 34.3* 33.3* 35.3* 34.4*  MCV 99.7 99.1 101.5* 103.5* 103.9*  PLT 245 234 229 238 231   BMP &GFR Recent Labs  Lab 09/09/23 2040 09/09/23 2146 09/10/23 0835 09/11/23 0526 09/12/23 0547 09/13/23 0547  NA 130*  --  132* 131* 134* 133*  K 2.4*  --  2.6* 3.0* 3.2* 4.1  CL 92*  --  99 99 101 105  CO2 25  --  23 23 26  21*  GLUCOSE 109*  --  79 106* 84 95  BUN <5*  --  <5* 5* 7 11  CREATININE 0.49*  --  <0.30* <0.30* 0.43* 0.33*  CALCIUM 8.4*  --  7.7* 8.0* 8.1* 8.1*  MG  --  2.2 2.1 2.2 2.6* 2.5*  PHOS  --   --  2.7 1.9*  --  3.3   Estimated Creatinine Clearance: 109.5 mL/min (A) (by C-G formula based on SCr of 0.33 mg/dL (L)). Liver & Pancreas: Recent Labs  Lab 09/09/23 2040 09/10/23 0835 09/11/23 0526 09/12/23 0547 09/13/23 0547  AST 157* 127* 122* 117* 107*  ALT 75* 60* 62* 68* 65*  ALKPHOS 187* 139* 140* 148* 123  BILITOT 29.7* 23.7* 22.3* 19.8* 16.2*  PROT 7.1 5.6* 6.0* 6.0* 5.4*  ALBUMIN 2.6* 2.0* 2.1* 2.1* 1.9*   Recent Labs  Lab 09/09/23 2040 09/10/23 0835  LIPASE 72* 54*   Recent Labs  Lab 09/09/23 2040 09/10/23 0835  AMMONIA 64* 39*   Diabetic: No results for input(s): "HGBA1C" in the last 72 hours. No results for input(s): "GLUCAP" in the last 168 hours. Cardiac Enzymes: No results for input(s): "CKTOTAL", "CKMB", "CKMBINDEX", "TROPONINI" in the last 168 hours. No results for input(s): "PROBNP" in the last 8760 hours. Coagulation Profile: Recent Labs  Lab 09/09/23 2040 09/10/23 0835 09/11/23 0526 09/12/23 0547 09/13/23 0547  INR 1.3* 1.4* 1.4* 1.3* 1.3*   Thyroid Function Tests: No results for input(s): "TSH", "T4TOTAL", "FREET4", "T3FREE", "THYROIDAB" in the last 72 hours. Lipid Profile: No results for input(s): "CHOL", "HDL", "LDLCALC", "TRIG", "CHOLHDL", "LDLDIRECT" in the last 72 hours. Anemia Panel: Recent Labs    09/12/23 0547  TIBC 175*   IRON 142   Urine analysis:    Component Value Date/Time   COLORURINE AMBER (A) 09/10/2023 0759   APPEARANCEUR HAZY (A) 09/10/2023 0759   LABSPEC >1.046 (H) 09/10/2023 0759   PHURINE 6.0 09/10/2023 0759   GLUCOSEU NEGATIVE 09/10/2023 0759   HGBUR NEGATIVE 09/10/2023 0759   BILIRUBINUR MODERATE (A) 09/10/2023 0759   KETONESUR 5 (A) 09/10/2023 0759   PROTEINUR NEGATIVE 09/10/2023 0759   NITRITE NEGATIVE 09/10/2023 0759   LEUKOCYTESUR NEGATIVE 09/10/2023 0759   Sepsis Labs: Invalid input(s): "PROCALCITONIN", "LACTICIDVEN"   SIGNED:  Almon Hercules, MD  [facility] 09/13/2023, 1:39 PM

## 2023-09-13 NOTE — Plan of Care (Signed)
Discharge paperwork explained, work note requested from doctor, all medications explained, follow up appointments. No further needs at this time.  Problem: Education: Goal: Knowledge of General Education information will improve Description: Including pain rating scale, medication(s)/side effects and non-pharmacologic comfort measures Outcome: Adequate for Discharge   Problem: Health Behavior/Discharge Planning: Goal: Ability to manage health-related needs will improve Outcome: Adequate for Discharge   Problem: Clinical Measurements: Goal: Ability to maintain clinical measurements within normal limits will improve Outcome: Adequate for Discharge Goal: Will remain free from infection Outcome: Adequate for Discharge Goal: Diagnostic test results will improve Outcome: Adequate for Discharge Goal: Respiratory complications will improve Outcome: Adequate for Discharge Goal: Cardiovascular complication will be avoided Outcome: Adequate for Discharge   Problem: Activity: Goal: Risk for activity intolerance will decrease Outcome: Adequate for Discharge   Problem: Nutrition: Goal: Adequate nutrition will be maintained Outcome: Adequate for Discharge   Problem: Coping: Goal: Level of anxiety will decrease Outcome: Adequate for Discharge   Problem: Elimination: Goal: Will not experience complications related to bowel motility Outcome: Adequate for Discharge Goal: Will not experience complications related to urinary retention Outcome: Adequate for Discharge   Problem: Pain Management: Goal: General experience of comfort will improve Outcome: Adequate for Discharge   Problem: Safety: Goal: Ability to remain free from injury will improve Outcome: Adequate for Discharge   Problem: Skin Integrity: Goal: Risk for impaired skin integrity will decrease Outcome: Adequate for Discharge

## 2023-09-13 NOTE — Progress Notes (Signed)
[facility] Progress Note  Thomas Wyatt 27 y.o. Apr 25, 1996  CC: Jaundice, alcoholic hepatitis and cirrhosis   Subjective: Patient seen and examined at bedside.  Denies any acute issues.  Feeling much better.  ROS : Afebrile, negative for chest pain   Objective: Vital signs in last 24 hours: Vitals:   09/12/23 1507 09/13/23 0544  BP: 104/70 106/67  Pulse: 74 75  Resp:  20  Temp: 98 F (36.7 C) 98.4 F (36.9 C)  SpO2: 92% 98%    Physical Exam:  General:  Alert, cooperative, no distress, appears stated age  Head:  Normocephalic, without obvious abnormality, atraumatic  Eyes:  , EOM's intact, scleral icterus noted  Lungs:   No visible respiratory distress  Heart:  Regular rate and rhythm, S1, S2 normal  Abdomen:   Soft, non-tender, bowel sounds active all four quadrants,  no masses,   Extremities: Extremities normal, atraumatic, no  edema  Pulses: 2+ and symmetric    Lab Results: Recent Labs    09/11/23 0526 09/12/23 0547 09/13/23 0547  NA 131* 134* 133*  K 3.0* 3.2* 4.1  CL 99 101 105  CO2 23 26 21*  GLUCOSE 106* 84 95  BUN 5* 7 11  CREATININE <0.30* 0.43* 0.33*  CALCIUM 8.0* 8.1* 8.1*  MG 2.2 2.6* 2.5*  PHOS 1.9*  --  3.3   Recent Labs    09/12/23 0547 09/13/23 0547  AST 117* 107*  ALT 68* 65*  ALKPHOS 148* 123  BILITOT 19.8* 16.2*  PROT 6.0* 5.4*  ALBUMIN 2.1* 1.9*   Recent Labs    09/11/23 0526 09/12/23 0547 09/13/23 0547  WBC 13.2* 14.4* 15.9*  NEUTROABS 8.9*  --   --   HGB 11.5* 12.2* 11.7*  HCT 33.3* 35.3* 34.4*  MCV 101.5* 103.5* 103.9*  PLT 229 238 231   Recent Labs    09/12/23 0547 09/13/23 0547  LABPROT 16.2* 16.3*  INR 1.3* 1.3*      Assessment/Plan: -Acute onset of jaundice in a patient with excessive alcohol use for last 5 years.  Surprisingly, CT scan showing findings concerning for cirrhosis and portal hypertension.  Given acuity of onset, this is most likely alcoholic hepatitis rather than decompensated  cirrhosis.  Discriminant function score of 32.  MELD 3.0 - 27 as of November 12 -Alcohol use   Recommendations ------------------------- -Patient T. Ludwig Clarks continues to improve on prednisolone. INR stable..   Recommend to continue prednisolone for total 28 days. -Okay to discharge from GI standpoint.  We will arrange for repeat blood work in 1 to 2 weeks after discharge at our clinic. -Absolute alcohol abstinence discussed with the patient.  Avoid hepatotoxic medications at this time.  -Normal ceruloplasmin, negative hepatitis panel, mildly elevated alpha-1 antitrypsin level which is nonspecific, normal ferritin but elevated iron saturation. -Normal Anti-smooth muscle antibody  -  antimitochondrial antibody pending       Kathi Der MD, FACP 09/13/2023, 10:03 AM  Contact #  [phone]

## 2023-09-13 NOTE — Plan of Care (Signed)

## 2023-09-22 ENCOUNTER — Ambulatory Visit (HOSPITAL_BASED_OUTPATIENT_CLINIC_OR_DEPARTMENT_OTHER): Admission: RE | Admit: 2023-09-22 | Payer: BC Managed Care – PPO | Source: Ambulatory Visit

## 2023-09-25 ENCOUNTER — Ambulatory Visit (HOSPITAL_BASED_OUTPATIENT_CLINIC_OR_DEPARTMENT_OTHER): Admission: RE | Admit: 2023-09-25 | Payer: BC Managed Care – PPO | Source: Ambulatory Visit

## 2023-11-13 DIAGNOSIS — J069 Acute upper respiratory infection, unspecified: Secondary | ICD-10-CM | POA: Diagnosis not present

## 2023-11-13 DIAGNOSIS — K7010 Alcoholic hepatitis without ascites: Secondary | ICD-10-CM | POA: Diagnosis not present

## 2023-11-13 DIAGNOSIS — R17 Unspecified jaundice: Secondary | ICD-10-CM | POA: Diagnosis not present

## 2023-11-29 DIAGNOSIS — R945 Abnormal results of liver function studies: Secondary | ICD-10-CM | POA: Diagnosis not present

## 2023-11-29 DIAGNOSIS — Z91410 Personal history of adult physical and sexual abuse: Secondary | ICD-10-CM | POA: Diagnosis not present

## 2023-11-29 DIAGNOSIS — Z79899 Other long term (current) drug therapy: Secondary | ICD-10-CM | POA: Diagnosis not present

## 2023-11-29 DIAGNOSIS — W010XXA Fall on same level from slipping, tripping and stumbling without subsequent striking against object, initial encounter: Secondary | ICD-10-CM | POA: Diagnosis not present

## 2023-11-29 DIAGNOSIS — J90 Pleural effusion, not elsewhere classified: Secondary | ICD-10-CM | POA: Diagnosis not present

## 2023-11-29 DIAGNOSIS — R17 Unspecified jaundice: Secondary | ICD-10-CM | POA: Diagnosis not present

## 2023-11-29 DIAGNOSIS — K766 Portal hypertension: Secondary | ICD-10-CM | POA: Diagnosis not present

## 2023-11-29 DIAGNOSIS — K7682 Hepatic encephalopathy: Secondary | ICD-10-CM | POA: Diagnosis present

## 2023-11-29 DIAGNOSIS — F1020 Alcohol dependence, uncomplicated: Secondary | ICD-10-CM | POA: Diagnosis present

## 2023-11-29 DIAGNOSIS — E876 Hypokalemia: Secondary | ICD-10-CM | POA: Diagnosis not present

## 2023-11-29 DIAGNOSIS — K7030 Alcoholic cirrhosis of liver without ascites: Secondary | ICD-10-CM | POA: Diagnosis not present

## 2023-11-29 DIAGNOSIS — Z7952 Long term (current) use of systemic steroids: Secondary | ICD-10-CM | POA: Diagnosis not present

## 2023-11-29 DIAGNOSIS — I8510 Secondary esophageal varices without bleeding: Secondary | ICD-10-CM | POA: Diagnosis present

## 2023-11-29 DIAGNOSIS — J9811 Atelectasis: Secondary | ICD-10-CM | POA: Diagnosis not present

## 2023-11-29 DIAGNOSIS — K7031 Alcoholic cirrhosis of liver with ascites: Principal | ICD-10-CM | POA: Diagnosis present

## 2023-11-29 DIAGNOSIS — M25562 Pain in left knee: Secondary | ICD-10-CM | POA: Diagnosis present

## 2023-11-29 DIAGNOSIS — R188 Other ascites: Secondary | ICD-10-CM | POA: Diagnosis not present

## 2023-11-29 DIAGNOSIS — F17210 Nicotine dependence, cigarettes, uncomplicated: Secondary | ICD-10-CM | POA: Diagnosis not present

## 2023-11-29 DIAGNOSIS — R918 Other nonspecific abnormal finding of lung field: Secondary | ICD-10-CM | POA: Diagnosis not present

## 2023-11-29 DIAGNOSIS — R0781 Pleurodynia: Secondary | ICD-10-CM | POA: Diagnosis not present

## 2023-11-29 DIAGNOSIS — E8809 Other disorders of plasma-protein metabolism, not elsewhere classified: Secondary | ICD-10-CM | POA: Diagnosis present

## 2023-11-29 DIAGNOSIS — R935 Abnormal findings on diagnostic imaging of other abdominal regions, including retroperitoneum: Secondary | ICD-10-CM | POA: Diagnosis not present

## 2023-11-29 DIAGNOSIS — K7460 Unspecified cirrhosis of liver: Secondary | ICD-10-CM | POA: Diagnosis not present

## 2023-11-29 DIAGNOSIS — D539 Nutritional anemia, unspecified: Secondary | ICD-10-CM | POA: Diagnosis not present

## 2023-11-29 DIAGNOSIS — Z043 Encounter for examination and observation following other accident: Secondary | ICD-10-CM | POA: Diagnosis not present

## 2023-11-29 DIAGNOSIS — R933 Abnormal findings on diagnostic imaging of other parts of digestive tract: Secondary | ICD-10-CM | POA: Diagnosis not present

## 2023-11-30 ENCOUNTER — Emergency Department (HOSPITAL_COMMUNITY): Payer: BC Managed Care – PPO

## 2023-11-30 ENCOUNTER — Encounter (HOSPITAL_COMMUNITY): Payer: Self-pay

## 2023-11-30 ENCOUNTER — Inpatient Hospital Stay (HOSPITAL_COMMUNITY)
Admission: EM | Admit: 2023-11-30 | Discharge: 2023-12-05 | DRG: 433 | Disposition: A | Discharge status: Home / Self Care | Payer: BC Managed Care – PPO | Attending: Internal Medicine | Admitting: Internal Medicine

## 2023-11-30 ENCOUNTER — Other Ambulatory Visit: Payer: Self-pay

## 2023-11-30 ENCOUNTER — Observation Stay (HOSPITAL_COMMUNITY): Payer: BC Managed Care – PPO

## 2023-11-30 DIAGNOSIS — R0781 Pleurodynia: Secondary | ICD-10-CM

## 2023-11-30 DIAGNOSIS — K7031 Alcoholic cirrhosis of liver with ascites: Secondary | ICD-10-CM

## 2023-11-30 DIAGNOSIS — M25562 Pain in left knee: Secondary | ICD-10-CM

## 2023-11-30 DIAGNOSIS — E8809 Other disorders of plasma-protein metabolism, not elsewhere classified: Secondary | ICD-10-CM

## 2023-11-30 DIAGNOSIS — R188 Other ascites: Principal | ICD-10-CM

## 2023-11-30 DIAGNOSIS — K7460 Unspecified cirrhosis of liver: Principal | ICD-10-CM | POA: Diagnosis present

## 2023-11-30 DIAGNOSIS — R0789 Other chest pain: Secondary | ICD-10-CM

## 2023-11-30 DIAGNOSIS — W19XXXA Unspecified fall, initial encounter: Secondary | ICD-10-CM

## 2023-11-30 HISTORY — DX: Abnormal levels of other serum enzymes: R74.8

## 2023-11-30 LAB — CBC
HCT: 35.6 % — ABNORMAL LOW (ref 39.0–52.0)
Hemoglobin: 11.7 g/dL — ABNORMAL LOW (ref 13.0–17.0)
MCH: 32 pg (ref 26.0–34.0)
MCHC: 32.9 g/dL (ref 30.0–36.0)
MCV: 97.3 fL (ref 80.0–100.0)
Platelets: 174 10*3/uL (ref 150–400)
RBC: 3.66 MIL/uL — ABNORMAL LOW (ref 4.22–5.81)
RDW: 16.9 % — ABNORMAL HIGH (ref 11.5–15.5)
WBC: 9.6 10*3/uL (ref 4.0–10.5)
nRBC: 0 % (ref 0.0–0.2)

## 2023-11-30 LAB — PROTIME-INR
INR: 1.4 — ABNORMAL HIGH (ref 0.8–1.2)
Prothrombin Time: 16.9 s — ABNORMAL HIGH (ref 11.4–15.2)

## 2023-11-30 LAB — COMPREHENSIVE METABOLIC PANEL
ALT: 18 U/L (ref 0–44)
AST: 38 U/L (ref 15–41)
Albumin: 1.9 g/dL — ABNORMAL LOW (ref 3.5–5.0)
Alkaline Phosphatase: 108 U/L (ref 38–126)
Anion gap: 7 (ref 5–15)
BUN: 6 mg/dL (ref 6–20)
CO2: 26 mmol/L (ref 22–32)
Calcium: 7.9 mg/dL — ABNORMAL LOW (ref 8.9–10.3)
Chloride: 101 mmol/L (ref 98–111)
Creatinine, Ser: 0.74 mg/dL (ref 0.61–1.24)
GFR, Estimated: >60 mL/min (ref >60)
Glucose, Bld: 107 mg/dL — ABNORMAL HIGH (ref 70–99)
Potassium: 3 mmol/L — ABNORMAL LOW (ref 3.5–5.1)
Sodium: 134 mmol/L — ABNORMAL LOW (ref 135–145)
Total Bilirubin: 3.4 mg/dL — ABNORMAL HIGH (ref 0.0–1.2)
Total Protein: 6 g/dL — ABNORMAL LOW (ref 6.5–8.1)

## 2023-11-30 LAB — BODY FLUID CELL COUNT WITH DIFFERENTIAL
Eos, Fluid: 0 %
Lymphs, Fluid: 40 %
Monocyte-Macrophage-Serous Fluid: 47 % — ABNORMAL LOW (ref 50–90)
Neutrophil Count, Fluid: 13 % (ref 0–25)
Total Nucleated Cell Count, Fluid: 139 uL (ref 0–1000)

## 2023-11-30 LAB — AMMONIA: Ammonia: 51 umol/L — ABNORMAL HIGH (ref 9–35)

## 2023-11-30 MED ORDER — HYDROMORPHONE HCL 1 MG/ML IJ SOLN: 0.5000 mg | Freq: Once | INTRAMUSCULAR | Status: DC

## 2023-11-30 MED ORDER — ENOXAPARIN SODIUM 40 MG/0.4ML IJ SOSY
40.0000 mg | PREFILLED_SYRINGE | INTRAMUSCULAR | Status: DC
  Administered 2023-11-30 – 2023-12-04 (×5): 40 mg via SUBCUTANEOUS
  Filled 2023-11-30 (×5): qty 0.4

## 2023-11-30 MED ORDER — POTASSIUM CHLORIDE 10 MEQ/100ML IV SOLN
10.0000 meq | Freq: Once | INTRAVENOUS | Status: AC
  Administered 2023-11-30: 10 meq via INTRAVENOUS
  Filled 2023-11-30: qty 100

## 2023-11-30 MED ORDER — OXYCODONE HCL 5 MG PO TABS
5.0000 mg | ORAL_TABLET | Freq: Once | ORAL | Status: AC
  Administered 2023-11-30: 5 mg via ORAL
  Filled 2023-11-30: qty 1

## 2023-11-30 MED ORDER — ONDANSETRON HCL 4 MG/2ML IJ SOLN
4.0000 mg | Freq: Four times a day (QID) | INTRAMUSCULAR | Status: DC | PRN
  Administered 2023-12-01: 4 mg via INTRAVENOUS
  Filled 2023-11-30: qty 2

## 2023-11-30 MED ORDER — ONDANSETRON HCL 4 MG/2ML IJ SOLN
4.0000 mg | Freq: Once | INTRAMUSCULAR | Status: AC
  Administered 2023-11-30: 4 mg via INTRAVENOUS
  Filled 2023-11-30: qty 2

## 2023-11-30 MED ORDER — FUROSEMIDE 40 MG PO TABS
40.0000 mg | ORAL_TABLET | Freq: Every day | ORAL | Status: DC
Start: 2023-11-30 — End: 2023-12-05
  Administered 2023-11-30 – 2023-12-05 (×6): 40 mg via ORAL
  Filled 2023-11-30 (×6): qty 1

## 2023-11-30 MED ORDER — SPIRONOLACTONE 25 MG PO TABS
100.0000 mg | ORAL_TABLET | Freq: Every day | ORAL | Status: DC
  Administered 2023-11-30 – 2023-12-05 (×6): 100 mg via ORAL
  Filled 2023-11-30 (×3): qty 4
  Filled 2023-11-30: qty 1
  Filled 2023-11-30 (×3): qty 4

## 2023-11-30 MED ORDER — IBUPROFEN 400 MG PO TABS
400.0000 mg | ORAL_TABLET | Freq: Four times a day (QID) | ORAL | Status: DC | PRN
  Administered 2023-11-30 – 2023-12-04 (×5): 400 mg via ORAL
  Filled 2023-11-30 (×5): qty 1

## 2023-11-30 MED ORDER — THIAMINE MONONITRATE 100 MG PO TABS
100.0000 mg | ORAL_TABLET | Freq: Every day | ORAL | Status: DC
  Administered 2023-11-30 – 2023-12-05 (×6): 100 mg via ORAL
  Filled 2023-11-30 (×6): qty 1

## 2023-11-30 MED ORDER — NADOLOL 20 MG PO TABS
20.0000 mg | ORAL_TABLET | Freq: Every day | ORAL | Status: DC
  Administered 2023-11-30 – 2023-12-01 (×2): 20 mg via ORAL
  Filled 2023-11-30 (×2): qty 1

## 2023-11-30 MED ORDER — POTASSIUM CHLORIDE CRYS ER 20 MEQ PO TBCR
40.0000 meq | EXTENDED_RELEASE_TABLET | Freq: Once | ORAL | Status: AC
  Administered 2023-11-30: 40 meq via ORAL
  Filled 2023-11-30: qty 2

## 2023-11-30 MED ORDER — ONDANSETRON HCL 4 MG PO TABS: 4.0000 mg | ORAL_TABLET | Freq: Four times a day (QID) | ORAL | Status: DC | PRN

## 2023-11-30 MED ORDER — TRAZODONE HCL 50 MG PO TABS: 25.0000 mg | ORAL_TABLET | Freq: Every evening | ORAL | Status: DC | PRN

## 2023-11-30 MED ORDER — HYDROMORPHONE HCL 1 MG/ML IJ SOLN
1.0000 mg | Freq: Once | INTRAMUSCULAR | Status: AC
  Administered 2023-11-30: 1 mg via INTRAVENOUS
  Filled 2023-11-30: qty 1

## 2023-11-30 MED ORDER — LIDOCAINE HCL 1 % IJ SOLN
INTRAMUSCULAR | Status: AC
  Filled 2023-11-30: qty 20

## 2023-11-30 MED ORDER — LACTULOSE 10 GM/15ML PO SOLN
10.0000 g | Freq: Three times a day (TID) | ORAL | Status: DC
  Administered 2023-11-30: 10 g via ORAL
  Filled 2023-11-30: qty 30

## 2023-11-30 MED ORDER — IOHEXOL 300 MG/ML  SOLN
100.0000 mL | Freq: Once | INTRAMUSCULAR | Status: AC | PRN
  Administered 2023-11-30: 100 mL via INTRAVENOUS

## 2023-11-30 MED ORDER — ALBUTEROL SULFATE (2.5 MG/3ML) 0.083% IN NEBU: 2.5000 mg | INHALATION_SOLUTION | RESPIRATORY_TRACT | Status: DC | PRN

## 2023-11-30 MED ORDER — PANTOPRAZOLE SODIUM 40 MG PO TBEC
40.0000 mg | DELAYED_RELEASE_TABLET | Freq: Every day | ORAL | Status: DC
  Administered 2023-12-01 – 2023-12-05 (×5): 40 mg via ORAL
  Filled 2023-11-30 (×5): qty 1

## 2023-11-30 MED ORDER — ADULT MULTIVITAMIN W/MINERALS CH
1.0000 | ORAL_TABLET | Freq: Every day | ORAL | Status: DC
  Administered 2023-11-30 – 2023-12-05 (×6): 1 via ORAL
  Filled 2023-11-30 (×5): qty 1

## 2023-11-30 MED ORDER — FOLIC ACID 1 MG PO TABS
1.0000 mg | ORAL_TABLET | Freq: Every day | ORAL | Status: DC
  Administered 2023-11-30 – 2023-12-05 (×6): 1 mg via ORAL
  Filled 2023-11-30 (×6): qty 1

## 2023-11-30 NOTE — ED Triage Notes (Signed)
POV/ brought in by fiance/ leg and ABD swelling/ past hx of liver issues/ pt appears jaundice/ pt reports falling today at work d/t to leg pain and swelling/ ABD swelling is new/ Pt is A&Ox4

## 2023-11-30 NOTE — ED Provider Notes (Signed)
[facility] AT [facility] Provider Note   CSN: [id number] Arrival date & time: 11/29/23  2351     History  Chief Complaint  Patient presents with   Leg Swelling   Bloated    Thomas Wyatt is a 28 y.o. male w/ hx of alcoholic cirrhosis presenting to the emergency department with abdominal bloating, rib pain and leg pain after fall.  Patient reports his abdomen is slowly been distending since he was last discharged from the hospital in November.  He feels that his legs are also swollen now and he is having some difficulty breathing.  He says that his legs are so swollen that he fell, striking his left knee on the ground and his right ribs.  He was not able to bear weight on his knee afterwards.  He does report he has not had any alcohol to drink since he was discharged in the hospital in November.  He has not been able to follow-up with a GI doctor yet.  Per my review of medical chart the patient was admitted for alcoholic cirrhosis suspected in November 2024.  He was evaluated by GI and had unremarkable acute hepatitis panel, HIV, ANA, ASMA, ferritin, ceruloplasmin levels.  He had elevated bilirubin at the time to 16.2 which was trending down.  He was noted to have a history of portal hypertension and esophageal varices.  HPI     Home Medications Prior to Admission medications   Medication Sig Start Date End Date Taking? Authorizing Provider  folic acid (FOLVITE) 1 MG tablet Take 1 tablet (1 mg total) by mouth daily. 09/13/23   Almon Hercules, MD  Multiple Vitamin (MULTIVITAMIN WITH MINERALS) TABS tablet Take 1 tablet by mouth daily. 09/13/23   Almon Hercules, MD  nadolol (CORGARD) 20 MG tablet Take 1 tablet (20 mg total) by mouth daily. 09/13/23   Almon Hercules, MD  pantoprazole (PROTONIX) 40 MG tablet Take 1 tablet (40 mg total) by mouth daily. 09/13/23   Almon Hercules, MD  prednisoLONE 5 MG TABS tablet Take 8 tablets (40 mg total) by mouth daily.  09/13/23   Almon Hercules, MD  thiamine (VITAMIN B-1) 100 MG tablet Take 1 tablet (100 mg total) by mouth daily. 09/13/23   Almon Hercules, MD      Allergies    Patient has no known allergies.    Review of Systems   Review of Systems  Physical Exam Updated Vital Signs BP 102/67 (BP Location: Left Arm)   Pulse 78   Temp 98.4 F (36.9 C) (Oral)   Resp 18   Wt 66.2 kg   SpO2 97%   BMI 22.20 kg/m  Physical Exam Constitutional:      General: He is not in acute distress. HENT:     Head: Normocephalic and atraumatic.  Eyes:     Conjunctiva/sclera: Conjunctivae normal.     Pupils: Pupils are equal, round, and reactive to light.  Cardiovascular:     Rate and Rhythm: Normal rate and regular rhythm.  Pulmonary:     Effort: Pulmonary effort is normal. No respiratory distress.     Comments: Rhonchi in the lower lobes, dry cough on exam Abdominal:     Tenderness: There is no abdominal tenderness.     Comments: Tense abdominal distention with fluid wave  Musculoskeletal:     Right lower leg: Edema present.     Left lower leg: Edema present.     Comments:  Right anterior lower chest wall tenderness, mild warm left knee effusion, no crepitus, limited range of motion of the left knee due to pain  Skin:    General: Skin is warm and dry.     Coloration: Skin is jaundiced.  Neurological:     General: No focal deficit present.     Mental Status: He is alert. Mental status is at baseline.  Psychiatric:        Mood and Affect: Mood normal.        Behavior: Behavior normal.     ED Results / Procedures / Treatments   Labs (all labs ordered are listed, but only abnormal results are displayed) Labs Reviewed  CBC - Abnormal; Notable for the following components:      Result Value   RBC 3.66 (*)    Hemoglobin 11.7 (*)    HCT 35.6 (*)    RDW 16.9 (*)    All other components within normal limits  COMPREHENSIVE METABOLIC PANEL - Abnormal; Notable for the following components:   Sodium  134 (*)    Potassium 3.0 (*)    Glucose, Bld 107 (*)    Calcium 7.9 (*)    Total Protein 6.0 (*)    Albumin 1.9 (*)    Total Bilirubin 3.4 (*)    All other components within normal limits  PROTIME-INR - Abnormal; Notable for the following components:   Prothrombin Time 16.9 (*)    INR 1.4 (*)    All other components within normal limits  AMMONIA - Abnormal; Notable for the following components:   Ammonia 51 (*)    All other components within normal limits  BODY FLUID CELL COUNT WITH DIFFERENTIAL - Abnormal; Notable for the following components:   Color, Fluid YELLOW (*)    Monocyte-Macrophage-Serous Fluid 47 (*)    All other components within normal limits  BODY FLUID CULTURE W GRAM STAIN  HEPATITIS A ANTIBODY, TOTAL  PATHOLOGIST SMEAR REVIEW    EKG None  Radiology DG Chest Portable 1 View Result Date: 11/30/2023 CLINICAL DATA:  right anterior lower rib pain s/p fall, eval for fx EXAM: PORTABLE CHEST - 1 VIEW COMPARISON:  09/10/2023 FINDINGS: New small to moderate right pleural effusion, with new atelectasis/consolidation at the right lung base. Left lung remains clear except for a subcentimeter calcified granuloma peripherally in the mid lung field. No pneumothorax. Heart size and mediastinal contours are within normal limits. Visualized bones unremarkable. IMPRESSION: New small to moderate right pleural effusion with right basilar atelectasis/consolidation. Electronically Signed   By: Corlis Leak M.D.   On: 11/30/2023 08:12   CT ABDOMEN PELVIS W CONTRAST Result Date: 11/30/2023 CLINICAL DATA:  Acute, nonlocalized abdominal pain EXAM: CT ABDOMEN AND PELVIS WITH CONTRAST TECHNIQUE: Multidetector CT imaging of the abdomen and pelvis was performed using the standard protocol following bolus administration of intravenous contrast. RADIATION DOSE REDUCTION: This exam was performed according to the departmental dose-optimization program which includes automated exposure control, adjustment of  the mA and/or kV according to patient size and/or use of iterative reconstruction technique. CONTRAST:  OMNIPAQUE IOHEXOL 300 MG/ML  SOLN COMPARISON:  09/09/2023 FINDINGS: Lower chest: Small pleural effusions with atelectasis at the right base. Extensive esophageal varices with periesophageal and subserosal enhancement similar to priors. There may be LAD atheromatous calcification. Hepatobiliary: Known cirrhosis without visible mass. The umbilical vein has recanalized. Ascites is large volume with abdominal distension.No evidence of biliary obstruction or stone. Pancreas: Unremarkable. Spleen: Unremarkable. Adrenals/Urinary Tract: Negative adrenals. No hydronephrosis  or stone. Unremarkable bladder. Stomach/Bowel:  No obstruction. No appendicitis. Vascular/Lymphatic: Portal hypertension as noted above. 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 No mass or adenopathy. Reproductive:No pathologic findings. Other: No ascites or pneumoperitoneum. Musculoskeletal: No acute abnormalities. IMPRESSION: 1. Cirrhosis large volume ascites distending the abdomen. 2. Small pleural effusions with multi segment atelectasis in the right lower lobe Electronically Signed   By: Tiburcio Pea M.D.   On: 11/30/2023 04:09   DG Knee Complete 4 Views Left Result Date: 11/30/2023 CLINICAL DATA:  Fall EXAM: LEFT KNEE - COMPLETE 4+ VIEW COMPARISON:  None Available. FINDINGS: No evidence of fracture, dislocation, or joint effusion. No evidence of arthropathy or other focal bone abnormality. Soft tissues are unremarkable. IMPRESSION: Negative. Electronically Signed   By: Deatra Robinson M.D.   On: 11/30/2023 00:48    Procedures Procedures    Medications Ordered in ED Medications  nadolol (CORGARD) tablet 20 mg (0 mg Oral Hold 11/30/23 1514)  pantoprazole (PROTONIX) EC tablet 40 mg (40 mg Oral Not Given 11/30/23 1514)  folic acid (FOLVITE) tablet 1 mg (1 mg Oral Given 11/30/23  1311)  multivitamin with minerals tablet 1 tablet (1 tablet Oral Given 11/30/23 1311)  thiamine (VITAMIN B1) tablet 100 mg (100 mg Oral Given 11/30/23 1311)  enoxaparin (LOVENOX) injection 40 mg (has no administration in time range)  ibuprofen (ADVIL) tablet 400 mg (has no administration in time range)  traZODone (DESYREL) tablet 25 mg (has no administration in time range)  ondansetron (ZOFRAN) tablet 4 mg (has no administration in time range)    Or  ondansetron (ZOFRAN) injection 4 mg (has no administration in time range)  albuterol (PROVENTIL) (2.5 MG/3ML) 0.083% nebulizer solution 2.5 mg (has no administration in time range)  lactulose (CHRONULAC) 10 GM/15ML solution 10 g (10 g Oral Given 11/30/23 1311)  oxyCODONE (Oxy IR/ROXICODONE) immediate release tablet 5 mg (5 mg Oral Given 11/30/23 0127)  ondansetron (ZOFRAN) injection 4 mg (4 mg Intravenous Given 11/30/23 0252)  iohexol (OMNIPAQUE) 300 MG/ML solution 100 mL (100 mLs Intravenous Contrast Given 11/30/23 0336)  HYDROmorphone (DILAUDID) injection 1 mg (1 mg Intravenous Given 11/30/23 0800)  potassium chloride 10 mEq in 100 mL IVPB (10 mEq Intravenous New Bag/Given 11/30/23 0806)  potassium chloride SA (KLOR-CON M) CR tablet 40 mEq (40 mEq Oral Given 11/30/23 1311)    ED Course/ Medical Decision Making/ A&P Clinical Course as of 11/30/23 1555  Sat Nov 30, 2023  0814 Admitted to Dr Erenest Blank hospitalist [MT]    Clinical Course User Index [MT] Renaye Rakers Kermit Balo, MD                                 Medical Decision Making Amount and/or Complexity of Data Reviewed Labs: ordered. Radiology: ordered.  Risk Prescription drug management. Decision regarding hospitalization.   This patient presents to the ED with concern for leg swelling, shortness of breath, fall, abdominal swelling. This involves an extensive number of treatment options, and is a complaint that carries with it a high risk of complications and morbidity.  The differential diagnosis  includes decompensated alcoholic cirrhosis most likely cause with portal edema and peripheral edema, as well as ascites  Overall his abdominal exam does not have focal tenderness, nor fever to suggest SBP.  No clear evidence of congestive heart failure from his history  Co-morbidities that complicate the patient evaluation: Former history of cirrhosis, drinking alcohol.  Thankfully he reports he has been able to stop drinking for the past 2 months, for which she was commended on.  Additional history obtained from the patient's fianc at bedside  External records from outside source obtained and reviewed including most recent hospitalization in November  I ordered and personally interpreted labs.  The pertinent results include: Labs are notable for bilirubin 3.4, which is significantly improved from his last hospitalization.  Hypoalbuminemia with albumin 1.9.  Calcium is low at 7.9 likely related to low albumin as well.  Potassium 3.0.  White blood cell count within normal limits.  Hemoglobin very mildly anemic 11.7.  INR 1.4.  Will add on an ammonia level  I ordered imaging studies including x-ray of the knee, CT abdomen pelvis I independently visualized and interpreted imaging which showed no acute fracture of the knee, there is an effusion of the knee, CT abdomen pelvis with large volume ascites I agree with the radiologist interpretation  The patient was maintained on a cardiac monitor.  I personally viewed and interpreted the cardiac monitored which showed an underlying rhythm of: Sinus rhythm   I ordered medication including oral and IV pain medication for pleuritic rib pain, which may be a suspected lower rib fracture, as well as left knee pain and effusion  I have reviewed the patients home medicines and have made adjustments as needed  Test Considered: Lower suspicion for acute PE, or SBP.  After the interventions noted above, I reevaluated the patient and found that they have:  stayed the same   Dispostion:  After consideration of the diagnostic results and the patients response to treatment, I feel that the patent would benefit from medical admission.  At this point I think the patient would benefit from a therapeutic paracentesis, albumin infusion, likely GI reconsultation.  He may need to be on lactulose as he does report constipation issues, although his ammonia level is pending.   He is also not able to bear weight on his left leg after his fall, does have an effusion, likely traumatic effusion of the left knee.  I think you will need a PT evaluation after his paracentesis, and may have significant improvement of his mobility if we are able to remove several liters of fluid from the abdomen.  He is also needing pain control for suspected right-sided lower rib fracture.         Final Clinical Impression(s) / ED Diagnoses Final diagnoses:  Cirrhosis of liver with ascites, unspecified hepatic cirrhosis type (HCC)  Hypoalbuminemia  Fall, initial encounter  Acute pain of left knee  Rib pain on right side    Rx / DC Orders ED Discharge Orders     None         Collyn Ribas, Kermit Balo, MD 11/30/23 1555
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
## 2023-11-30 NOTE — H&P (Addendum)
History and Physical  Thomas Wyatt:[id number] DOB: [birth year] DOA: 11/30/2023  PCP: Genia Hotter, FNP   Chief Complaint: Abdominal swelling, leg swelling  HPI: Thomas Wyatt is a 28 y.o. male with medical history significant for tobacco use disorder prior heavy alcohol abuse recently diagnosed with alcoholic liver cirrhosis in November 2024 being admitted to the hospital with ascites.  He was seen in consultation by [facility] during his last hospital stay, was started on prednisolone and experienced improving bilirubin levels.  He has been compliant with his medication, has been doing well overall, has not yet followed up as an outpatient with [facility].  Over the last week or so, he has noticed increasing abdominal girth, as well as lower extremity pitting edema.  This got worse this past week when he went back to work, where he spends most of the day on his feet.  Denies any significant abdominal pain, fevers, nausea, or other complaints.  He was at work this morning, lost his footing and fell onto his left knee and side, currently has left knee pain and right-sided rib pain.  Review of Systems: Please see HPI for pertinent positives and negatives. A complete 10 system review of systems are otherwise negative.  Past Medical History:  Diagnosis Date   Liver enzyme elevation    Past Surgical History:  Procedure Laterality Date   LUMBAR DISC SURGERY N/A    Social History:  reports that he has been smoking cigarettes. He has never used smokeless tobacco. He reports that he does not currently use alcohol after a past usage of about 10.0 standard drinks of alcohol per week. He reports that he does not use drugs.  No Known Allergies  History reviewed. No pertinent family history.   Prior to Admission medications   Medication Sig Start Date End Date Taking? Authorizing Provider  folic acid (FOLVITE) 1 MG tablet Take 1 tablet (1 mg total) by mouth daily. 09/13/23   Almon Hercules, MD   Multiple Vitamin (MULTIVITAMIN WITH MINERALS) TABS tablet Take 1 tablet by mouth daily. 09/13/23   Almon Hercules, MD  nadolol (CORGARD) 20 MG tablet Take 1 tablet (20 mg total) by mouth daily. 09/13/23   Almon Hercules, MD  pantoprazole (PROTONIX) 40 MG tablet Take 1 tablet (40 mg total) by mouth daily. 09/13/23   Almon Hercules, MD  prednisoLONE 5 MG TABS tablet Take 8 tablets (40 mg total) by mouth daily. 09/13/23   Almon Hercules, MD  thiamine (VITAMIN B-1) 100 MG tablet Take 1 tablet (100 mg total) by mouth daily. 09/13/23   Almon Hercules, MD    Physical Exam: BP 112/77 (BP Location: Left Arm)   Pulse 87   Temp 98.5 F (36.9 C) (Oral)   Resp 18   Wt 66.2 kg   SpO2 96%   BMI 22.20 kg/m  General:  Alert, oriented, calm, not in any acute distress, somewhat slow responses.  He looks somewhat thin and emaciated. Cardiovascular: RRR, no murmurs or rubs, he has 3+ pitting edema up to the mid thighs bilaterally Respiratory: clear to auscultation bilaterally, no wheezes, no crackles  Abdomen: soft, nontender, impressively distended with fluid wave, normal bowel tones heard  Skin: dry, no rashes  Musculoskeletal: no joint effusions, tender left knee with limited range of motion due to pain Psychiatric: appropriate affect, normal speech  Neurologic: extraocular muscles intact, clear speech, moving all extremities with intact sensorium  Labs on Admission:  Basic Metabolic Panel: Recent Labs  Lab 11/30/23 0122  NA 134*  K 3.0*  CL 101  CO2 26  GLUCOSE 107*  BUN 6  CREATININE 0.74  CALCIUM 7.9*   Liver Function Tests: Recent Labs  Lab 11/30/23 0122  AST 38  ALT 18  ALKPHOS 108  BILITOT 3.4*  PROT 6.0*  ALBUMIN 1.9*   No results for input(s): "LIPASE", "AMYLASE" in the last 168 hours. Recent Labs  Lab 11/30/23 0800  AMMONIA 51*   CBC: Recent Labs  Lab 11/30/23 0122  WBC 9.6  HGB 11.7*  HCT 35.6*  MCV 97.3  PLT 174   Cardiac Enzymes: No results for  input(s): "CKTOTAL", "CKMB", "CKMBINDEX", "TROPONINI" in the last 168 hours. BNP (last 3 results) No results for input(s): "BNP" in the last 8760 hours.  ProBNP (last 3 results) No results for input(s): "PROBNP" in the last 8760 hours.  CBG: No results for input(s): "GLUCAP" in the last 168 hours.  Radiological Exams on Admission: DG Chest Portable 1 View Result Date: 11/30/2023 CLINICAL DATA:  right anterior lower rib pain s/p fall, eval for fx EXAM: PORTABLE CHEST - 1 VIEW COMPARISON:  09/10/2023 FINDINGS: New small to moderate right pleural effusion, with new atelectasis/consolidation at the right lung base. Left lung remains clear except for a subcentimeter calcified granuloma peripherally in the mid lung field. No pneumothorax. Heart size and mediastinal contours are within normal limits. Visualized bones unremarkable. IMPRESSION: New small to moderate right pleural effusion with right basilar atelectasis/consolidation. Electronically Signed   By: Corlis Leak M.D.   On: 11/30/2023 08:12   CT ABDOMEN PELVIS W CONTRAST Result Date: 11/30/2023 CLINICAL DATA:  Acute, nonlocalized abdominal pain EXAM: CT ABDOMEN AND PELVIS WITH CONTRAST TECHNIQUE: Multidetector CT imaging of the abdomen and pelvis was performed using the standard protocol following bolus administration of intravenous contrast. RADIATION DOSE REDUCTION: This exam was performed according to the departmental dose-optimization program which includes automated exposure control, adjustment of the mA and/or kV according to patient size and/or use of iterative reconstruction technique. CONTRAST:  OMNIPAQUE IOHEXOL 300 MG/ML  SOLN COMPARISON:  09/09/2023 FINDINGS: Lower chest: Small pleural effusions with atelectasis at the right base. Extensive esophageal varices with periesophageal and subserosal enhancement similar to priors. There may be LAD atheromatous calcification. Hepatobiliary: Known cirrhosis without visible mass. The umbilical  vein has recanalized. Ascites is large volume with abdominal distension.No evidence of biliary obstruction or stone. Pancreas: Unremarkable. Spleen: Unremarkable. Adrenals/Urinary Tract: Negative adrenals. No hydronephrosis or stone. Unremarkable bladder. Stomach/Bowel:  No obstruction. No appendicitis. Vascular/Lymphatic: Portal hypertension as noted above. 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 No mass or adenopathy. Reproductive:No pathologic findings. Other: No ascites or pneumoperitoneum. Musculoskeletal: No acute abnormalities. IMPRESSION: 1. Cirrhosis large volume ascites distending the abdomen. 2. Small pleural effusions with multi segment atelectasis in the right lower lobe Electronically Signed   By: Tiburcio Pea M.D.   On: 11/30/2023 04:09   DG Knee Complete 4 Views Left Result Date: 11/30/2023 CLINICAL DATA:  Fall EXAM: LEFT KNEE - COMPLETE 4+ VIEW COMPARISON:  None Available. FINDINGS: No evidence of fracture, dislocation, or joint effusion. No evidence of arthropathy or other focal bone abnormality. Soft tissues are unremarkable. IMPRESSION: Negative. Electronically Signed   By: Deatra Robinson M.D.   On: 11/30/2023 00:48   Assessment/Plan LELA GELL is a 28 y.o. male with medical history significant for tobacco use disorder prior heavy alcohol abuse  recently diagnosed with alcoholic liver cirrhosis in November 2024 being admitted to the hospital with ascites.  Alcoholic liver cirrhosis-thankfully patient has been abstinent from alcohol since November, now presents with mild hepatic encephalopathy and abdominal ascites.  He completed a course of prednisolone since hospital discharge, has not yet followed up with gastroenterology.  Bilirubin has significantly improved. -Observation admission -IR therapeutic paracentesis, no evidence of SBP (no fever, abdominal pain, etc.) -Start lactulose, titrate to 3 bowel movements per  day -Discussed with GI Dr. Lorenso Quarry who will evaluate today -Would likely benefit from initiation of Lasix/Aldactone to help manage his ascites  Esophageal varices-continue nadolol  Fall-with left knee pain and right-sided rib pain, thankfully x-rays without any acute fracture.  Hepatic encephalopathy-mild, ammonia 51, treating as above  Macrocytic anemia-chronic and stable, due to his liver disease  DVT prophylaxis: Lovenox     Code Status: Full Code  Consults called: [facility] Dr. Lorenso Quarry  Admission status: Observation  Time spent: 48 minutes  Traeson Dusza Sharlette Dense MD [facility] Pager [phone]  If 7PM-7AM, please contact night-coverage [email or web address] Password [facility]  11/30/2023, 10:38 AM

## 2023-11-30 NOTE — Plan of Care (Signed)
  Problem: Coping: Goal: Level of anxiety will decrease Outcome: Progressing   Problem: Pain Managment: Goal: General experience of comfort will improve and/or be controlled Outcome: Progressing   Problem: Safety: Goal: Ability to remain free from injury will improve Outcome: Progressing

## 2023-11-30 NOTE — ED Notes (Signed)
Patient informed Tech (EMT first) that he is going to step outside (in front of the ED doors) to get some fresh air. Triage nurse was made aware.

## 2023-11-30 NOTE — Procedures (Addendum)
PROCEDURE SUMMARY:  Successful image-guided paracentesis from the right  lower abdomen.  Yielded 7.3 liters of hazy yellow fluid.  No immediate complications.  EBL = trace. Patient tolerated well.   Specimen was  sent for labs.  Please see imaging section of Epic for full dictation.  If the patient eventually requires >/=2 paracenteses in a 30 day period, screening evaluation by the [facility] will be assessed.  Attempted to order 50 g albumin but patient has blood product refusal warning. Will defer albumin order to be place by the medicine team.   Lynann Bologna Zyon Grout PA-C 11/30/2023 11:13 AM

## 2023-11-30 NOTE — ED Notes (Signed)
Off the floor to IR.

## 2023-11-30 NOTE — ED Provider Triage Note (Signed)
Emergency Medicine Provider Triage Evaluation Note  Thomas Wyatt , a 28 y.o. male  was evaluated in triage.  Pt complains of abdominal bloating.  History of cirrhosis.  Legs are now very swollen as well.  Swelling impairing ambulation, fell and hurt his left knee today.  Review of Systems  Positive: Abdominal swelling, leg swelling, left knee Negative: No fever  Physical Exam  BP 124/85 (BP Location: Left Arm)   Pulse 92   Temp 98.9 F (37.2 C) (Oral)   Resp 18   Wt 66.2 kg   SpO2 98%   BMI 22.20 kg/m  Gen:   Awake, no distress   Resp:  Normal effort  MSK:   Tender left knee with reduced range of motion due to pain Other:  Protuberant abdomen with fluid wave, lower extremity edema  Medical Decision Making  Medically screening exam initiated at 12:16 AM.  Appropriate orders placed.  Thomas Wyatt was informed that the remainder of the evaluation will be completed by another provider, this initial triage assessment does not replace that evaluation, and the importance of remaining in the ED until their evaluation is complete.  Suspect need for admission for decompensated cirrhosis.   Sabas Sous, MD 11/30/23 [phone]

## 2023-11-30 NOTE — Plan of Care (Signed)
  Problem: Education: Goal: Knowledge of General Education information will improve Description: Including pain rating scale, medication(s)/side effects and non-pharmacologic comfort measures Outcome: Progressing   Problem: Activity: Goal: Risk for activity intolerance will decrease Outcome: Progressing   Problem: Pain Managment: Goal: General experience of comfort will improve and/or be controlled Outcome: Progressing

## 2023-12-01 DIAGNOSIS — K7460 Unspecified cirrhosis of liver: Secondary | ICD-10-CM | POA: Diagnosis present

## 2023-12-01 DIAGNOSIS — K7030 Alcoholic cirrhosis of liver without ascites: Secondary | ICD-10-CM | POA: Diagnosis not present

## 2023-12-01 DIAGNOSIS — K766 Portal hypertension: Secondary | ICD-10-CM | POA: Diagnosis present

## 2023-12-01 DIAGNOSIS — M25562 Pain in left knee: Secondary | ICD-10-CM | POA: Diagnosis present

## 2023-12-01 DIAGNOSIS — Z91410 Personal history of adult physical and sexual abuse: Secondary | ICD-10-CM | POA: Diagnosis not present

## 2023-12-01 DIAGNOSIS — K7682 Hepatic encephalopathy: Secondary | ICD-10-CM | POA: Diagnosis present

## 2023-12-01 DIAGNOSIS — F17210 Nicotine dependence, cigarettes, uncomplicated: Secondary | ICD-10-CM | POA: Diagnosis present

## 2023-12-01 DIAGNOSIS — I8510 Secondary esophageal varices without bleeding: Secondary | ICD-10-CM | POA: Diagnosis present

## 2023-12-01 DIAGNOSIS — Z7952 Long term (current) use of systemic steroids: Secondary | ICD-10-CM | POA: Diagnosis not present

## 2023-12-01 DIAGNOSIS — F1020 Alcohol dependence, uncomplicated: Secondary | ICD-10-CM | POA: Diagnosis present

## 2023-12-01 DIAGNOSIS — Z79899 Other long term (current) drug therapy: Secondary | ICD-10-CM | POA: Diagnosis not present

## 2023-12-01 DIAGNOSIS — D539 Nutritional anemia, unspecified: Secondary | ICD-10-CM | POA: Diagnosis present

## 2023-12-01 DIAGNOSIS — W010XXA Fall on same level from slipping, tripping and stumbling without subsequent striking against object, initial encounter: Secondary | ICD-10-CM | POA: Diagnosis present

## 2023-12-01 DIAGNOSIS — K7031 Alcoholic cirrhosis of liver with ascites: Secondary | ICD-10-CM | POA: Diagnosis present

## 2023-12-01 DIAGNOSIS — R0781 Pleurodynia: Secondary | ICD-10-CM | POA: Diagnosis present

## 2023-12-01 DIAGNOSIS — E876 Hypokalemia: Secondary | ICD-10-CM | POA: Diagnosis not present

## 2023-12-01 DIAGNOSIS — E8809 Other disorders of plasma-protein metabolism, not elsewhere classified: Secondary | ICD-10-CM | POA: Diagnosis present

## 2023-12-01 LAB — CBC
HCT: 34 % — ABNORMAL LOW (ref 39.0–52.0)
Hemoglobin: 11.2 g/dL — ABNORMAL LOW (ref 13.0–17.0)
MCH: 32.2 pg (ref 26.0–34.0)
MCHC: 32.9 g/dL (ref 30.0–36.0)
MCV: 97.7 fL (ref 80.0–100.0)
Platelets: 152 10*3/uL (ref 150–400)
RBC: 3.48 MIL/uL — ABNORMAL LOW (ref 4.22–5.81)
RDW: 16.8 % — ABNORMAL HIGH (ref 11.5–15.5)
WBC: 7.9 10*3/uL (ref 4.0–10.5)
nRBC: 0 % (ref 0.0–0.2)

## 2023-12-01 LAB — COMPREHENSIVE METABOLIC PANEL
ALT: 16 U/L (ref 0–44)
AST: 31 U/L (ref 15–41)
Albumin: 1.5 g/dL — ABNORMAL LOW (ref 3.5–5.0)
Alkaline Phosphatase: 90 U/L (ref 38–126)
Anion gap: 6 (ref 5–15)
BUN: 7 mg/dL (ref 6–20)
CO2: 27 mmol/L (ref 22–32)
Calcium: 7.4 mg/dL — ABNORMAL LOW (ref 8.9–10.3)
Chloride: 99 mmol/L (ref 98–111)
Creatinine, Ser: 0.69 mg/dL (ref 0.61–1.24)
GFR, Estimated: >60 mL/min (ref >60)
Glucose, Bld: 68 mg/dL — ABNORMAL LOW (ref 70–99)
Potassium: 3.3 mmol/L — ABNORMAL LOW (ref 3.5–5.1)
Sodium: 132 mmol/L — ABNORMAL LOW (ref 135–145)
Total Bilirubin: 2.9 mg/dL — ABNORMAL HIGH (ref 0.0–1.2)
Total Protein: 4.9 g/dL — ABNORMAL LOW (ref 6.5–8.1)

## 2023-12-01 LAB — HEPATITIS A ANTIBODY, TOTAL: hep A Total Ab: NONREACTIVE

## 2023-12-01 LAB — MAGNESIUM: Magnesium: 2 mg/dL (ref 1.7–2.4)

## 2023-12-01 LAB — PHOSPHORUS: Phosphorus: 2.5 mg/dL (ref 2.5–4.6)

## 2023-12-01 MED ORDER — METOPROLOL TARTRATE 5 MG/5ML IV SOLN: 5.0000 mg | INTRAVENOUS | Status: DC | PRN

## 2023-12-01 MED ORDER — ACETAMINOPHEN 325 MG PO TABS
650.0000 mg | ORAL_TABLET | Freq: Four times a day (QID) | ORAL | Status: DC | PRN
  Administered 2023-12-01: 650 mg via ORAL
  Filled 2023-12-01: qty 2

## 2023-12-01 MED ORDER — SENNOSIDES-DOCUSATE SODIUM 8.6-50 MG PO TABS: 1.0000 | ORAL_TABLET | Freq: Every evening | ORAL | Status: DC | PRN

## 2023-12-01 MED ORDER — LACTULOSE 10 GM/15ML PO SOLN
30.0000 g | Freq: Three times a day (TID) | ORAL | Status: DC
  Administered 2023-12-01 – 2023-12-02 (×3): 30 g via ORAL
  Filled 2023-12-01 (×3): qty 45

## 2023-12-01 MED ORDER — MIDODRINE HCL 5 MG PO TABS
10.0000 mg | ORAL_TABLET | Freq: Three times a day (TID) | ORAL | Status: DC
  Administered 2023-12-01 (×2): 10 mg via ORAL
  Filled 2023-12-01 (×4): qty 2

## 2023-12-01 MED ORDER — IPRATROPIUM-ALBUTEROL 0.5-2.5 (3) MG/3ML IN SOLN: 3.0000 mL | RESPIRATORY_TRACT | Status: DC | PRN

## 2023-12-01 MED ORDER — LORAZEPAM 1 MG PO TABS
1.0000 mg | ORAL_TABLET | ORAL | Status: AC | PRN
Start: 2023-12-01 — End: 2023-12-04

## 2023-12-01 MED ORDER — LORAZEPAM 2 MG/ML IJ SOLN
1.0000 mg | INTRAMUSCULAR | Status: AC | PRN
Start: 2023-12-01 — End: 2023-12-04

## 2023-12-01 MED ORDER — HYDRALAZINE HCL 20 MG/ML IJ SOLN: 10.0000 mg | INTRAMUSCULAR | Status: DC | PRN

## 2023-12-01 MED ORDER — ALBUMIN HUMAN 25 % IV SOLN
25.0000 g | Freq: Four times a day (QID) | INTRAVENOUS | Status: AC
  Administered 2023-12-01: 25 g via INTRAVENOUS
  Administered 2023-12-01: 12.5 g via INTRAVENOUS
  Administered 2023-12-02 (×3): 25 g via INTRAVENOUS
  Filled 2023-12-01 (×6): qty 100

## 2023-12-01 MED ORDER — GUAIFENESIN 100 MG/5ML PO LIQD: 5.0000 mL | ORAL | Status: DC | PRN

## 2023-12-01 NOTE — Hospital Course (Addendum)
Brief Narrative:   28 year old with history of tobacco use, heavy alcohol abuse with liver cirrhosis diagnosed in November 2024 admitted for ascites.  [facility] was consulted.  Patient underwent paracentesis, 7.3 L was removed by IR on 2/1.  CT abdomen pelvis shows small pleural effusion, extensive esophageal varices, large ascites. Currently patient with soft blood pressure therefore ongoing management between diuretics and midodrine.  Assessment & Plan:  Principal Problem:   Liver cirrhosis (HCC)    Decompensated alcoholic liver cirrhosis Ascites Large esophageal varices - Status post large-volume paracentesis 7.3 L removed.  Currently due to soft blood pressure patient is on midodrine.  Also received albumin.  Will require another paracentesis closer to discharge.  Depending on blood pressure, diuretic response and repeated need for paracentesis, he may benefit from TIPS.  This can be determined outpatient. -Patient will need endoscopic evaluation and likely prophylactic banding for esophageal varices but I will defer this to [facility].   Mild hepatic encephalopathy - Lactulose.  Hypokalemia -As needed repletion. - Mag and Phos normal  Alcohol abuse - Alcohol withdrawal protocol.  Multivitamin, folic acid and thiamine ordered  DVT prophylaxis:Lovenox    Code Status: Full Code Continue hospital stay for management of decompensated liver cirrhosis    Subjective: Systolic blood pressure slowly improving in 90s. Overall doing better.  Agreeable for likely paracentesis tomorrow  Examination:  General exam: Appears calm and comfortable  Respiratory system: Clear to auscultation. Respiratory effort normal. Cardiovascular system: S1 & S2 heard, RRR. No JVD, murmurs, rubs, gallops or clicks. No pedal edema. Gastrointestinal system: Distended abdomen with positive fluid wave shift.  It is still not as tense yet Central nervous system: Alert and oriented. No focal neurological  deficits. Extremities: Symmetric 5 x 5 power. Skin: No rashes, lesions or ulcers Psychiatry: Judgement and insight appear normal. Mood & affect appropriate.

## 2023-12-01 NOTE — Plan of Care (Signed)
  Problem: Clinical Measurements: Goal: Diagnostic test results will improve Outcome: Progressing Goal: Cardiovascular complication will be avoided Outcome: Progressing   Problem: Activity: Goal: Risk for activity intolerance will decrease Outcome: Progressing   

## 2023-12-01 NOTE — Progress Notes (Signed)
PROGRESS NOTE    Thomas Wyatt  ZOX:[id number] DOB: 1996-01-02 DOA: 11/30/2023 PCP: Genia Hotter, FNP    Brief Narrative:   28 year old with history of tobacco use, heavy alcohol abuse with liver cirrhosis diagnosed in November 2024 admitted for ascites.  [facility] was consulted.  Patient underwent paracentesis, 7.3 L was removed by IR on 2/1.  CT abdomen pelvis shows small pleural effusion, extensive esophageal varices, large ascites.  Assessment & Plan:  Principal Problem:   Liver cirrhosis (HCC)    Decompensated alcoholic liver cirrhosis Ascites Large esophageal varices - Status post large-volume paracentesis 7.3 L removed.  Followed by [facility].  Low-salt diet.  Lasix and Aldactone as tolerated.  Due to soft blood pressure will start midodrine 10 mg 3 times daily, albumin 25 g every 6 hours.  Patient is agreeable to blood products. - Hold off on nadolol for now -Patient will need endoscopic evaluation and likely prophylactic banding for esophageal varices but I will defer this to [facility].  Continue nadolol  Mild hepatic encephalopathy - Lactulose.  Hypokalemia -As needed repletion.  Check magnesium and phosphorus  Alcohol abuse - Alcohol withdrawal protocol.  Multivitamin, folic acid and thiamine ordered  DVT prophylaxis:Lovenox    Code Status: Full Code Continue hospital stay for management of decompensated liver cirrhosis    Subjective: Tells me he feels a little better compared to yesterday in terms of abdominal distention He is agreeable to receive albumin.  He does not know why his chart has he is not receiving blood products.   Examination:  General exam: Appears calm and comfortable  Respiratory system: Clear to auscultation. Respiratory effort normal. Cardiovascular system: S1 & S2 heard, RRR. No JVD, murmurs, rubs, gallops or clicks. No pedal edema. Gastrointestinal system: Abdomen is nondistended, soft and nontender. No organomegaly or masses felt.  Normal bowel sounds heard. Central nervous system: Alert and oriented. No focal neurological deficits. Extremities: Symmetric 5 x 5 power. Skin: No rashes, lesions or ulcers Psychiatry: Judgement and insight appear normal. Mood & affect appropriate.                Diet Orders (From admission, onward)     Start     Ordered   11/30/23 1206  Diet 2 gram sodium Room service appropriate? Yes; Fluid consistency: Thin  Diet effective now       Question Answer Comment  Room service appropriate? Yes   Fluid consistency: Thin      11/30/23 1205            Objective: Vitals:   11/30/23 2135 12/01/23 0156 12/01/23 0601 12/01/23 1009  BP: 94/63 95/61 (!) 95/52 (!) 85/50  Pulse: 68 72 71 66  Resp: 15 16 17 16   Temp: 98.2 F (36.8 C) 98 F (36.7 C) 97.6 F (36.4 C) 98.7 F (37.1 C)  TempSrc: Oral Oral  Oral  SpO2: 99% 96% 97% 96%  Weight:      Height:        Intake/Output Summary (Last 24 hours) at 12/01/2023 1219 Last data filed at 12/01/2023 1012 Gross per 24 hour  Intake 720 ml  Output 602 ml  Net 118 ml   Filed Weights   11/30/23 0007 11/30/23 1742  Weight: 66.2 kg 66.2 kg    Scheduled Meds:  enoxaparin (LOVENOX) injection  40 mg Subcutaneous Q24H   folic acid  1 mg Oral Daily   furosemide  40 mg Oral Daily   lactulose  30 g Oral  TID   midodrine  10 mg Oral TID WC   multivitamin with minerals  1 tablet Oral Daily   pantoprazole  40 mg Oral Daily   spironolactone  100 mg Oral Daily   thiamine  100 mg Oral Daily   Continuous Infusions:  albumin human      Nutritional status     Body mass index is 22.2 kg/m.  Data Reviewed:   CBC: Recent Labs  Lab 11/30/23 0122 12/01/23 0339  WBC 9.6 7.9  HGB 11.7* 11.2*  HCT 35.6* 34.0*  MCV 97.3 97.7  PLT 174 152   Basic Metabolic Panel: Recent Labs  Lab 11/30/23 0122 12/01/23 0339  NA 134* 132*  K 3.0* 3.3*  CL 101 99  CO2 26 27  GLUCOSE 107* 68*  BUN 6 7  CREATININE 0.74 0.69  CALCIUM 7.9*  7.4*  MG  --  2.0  PHOS  --  2.5   GFR: Estimated Creatinine Clearance: 128.7 mL/min (by C-G formula based on SCr of 0.69 mg/dL). Liver Function Tests: Recent Labs  Lab 11/30/23 0122 12/01/23 0339  AST 38 31  ALT 18 16  ALKPHOS 108 90  BILITOT 3.4* 2.9*  PROT 6.0* 4.9*  ALBUMIN 1.9* 1.5*   No results for input(s): "LIPASE", "AMYLASE" in the last 168 hours. Recent Labs  Lab 11/30/23 0800  AMMONIA 51*   Coagulation Profile: Recent Labs  Lab 11/30/23 0122  INR 1.4*   Cardiac Enzymes: No results for input(s): "CKTOTAL", "CKMB", "CKMBINDEX", "TROPONINI" in the last 168 hours. BNP (last 3 results) No results for input(s): "PROBNP" in the last 8760 hours. HbA1C: No results for input(s): "HGBA1C" in the last 72 hours. CBG: No results for input(s): "GLUCAP" in the last 168 hours. Lipid Profile: No results for input(s): "CHOL", "HDL", "LDLCALC", "TRIG", "CHOLHDL", "LDLDIRECT" in the last 72 hours. Thyroid Function Tests: No results for input(s): "TSH", "T4TOTAL", "FREET4", "T3FREE", "THYROIDAB" in the last 72 hours. Anemia Panel: No results for input(s): "VITAMINB12", "FOLATE", "FERRITIN", "TIBC", "IRON", "RETICCTPCT" in the last 72 hours. Sepsis Labs: No results for input(s): "PROCALCITON", "LATICACIDVEN" in the last 168 hours.  Recent Results (from the past 240 hours)  Culture, body fluid w Gram Stain-bottle     Status: None (Preliminary result)   Collection Time: 11/30/23 11:20 AM   Specimen: Path fluid  Result Value Ref Range Status   Specimen Description FLUID  Final   Special Requests NONE  Final   Culture   Final    NO GROWTH < 24 HOURS Performed at [facility], [address] [place], [place] [address]    Report Status PENDING  Incomplete  Gram stain     Status: None (Preliminary result)   Collection Time: 11/30/23 11:20 AM   Specimen: Path fluid  Result Value Ref Range Status   Specimen Description FLUID  Final   Special Requests NONE  Final    Gram Stain   Final    NO WBC SEEN NO ORGANISMS SEEN Performed at [facility], [address], [place], [place] [address]    Report Status PENDING  Incomplete         Radiology Studies: US Paracentesis Result Date: 11/30/2023 INDICATION: 28 year old male with recent diagnosis of alcoholic cirrhosis presents with abdominal distension, previous imaging showed ascites. Request for therapeutic and diagnostic paracentesis. EXAM: ULTRASOUND GUIDED  PARACENTESIS MEDICATIONS: 8 mL 1% lidocaine COMPLICATIONS: None immediate. PROCEDURE: Informed written consent was obtained from the patient after a discussion of the  risks, benefits and alternatives to treatment. A timeout was performed prior to the initiation of the procedure. Initial ultrasound scanning demonstrates a large amount of ascites within the right lower abdominal quadrant. The right lower abdomen was prepped and draped in the usual sterile fashion. 1% lidocaine was used for local anesthesia. Following this, a 19 gauge, 7-cm, Yueh catheter was introduced. An ultrasound image was saved for documentation purposes. The paracentesis was performed. The catheter was removed and a dressing was applied. The patient tolerated the procedure well without immediate post procedural complication. Per EMR, patient is refusing blood products. FINDINGS: A total of approximately 7.3 L of hazy yellow fluid was removed. Samples were sent to the laboratory as requested by the clinical team. IMPRESSION: Successful ultrasound-guided paracentesis yielding 7.3 liters of peritoneal fluid. PLAN: If the patient eventually requires >/=2 paracenteses in a 30 day period, candidacy for formal evaluation by the [facility] Interventional Radiology Portal Hypertension [facility] will be assessed. Performed by: Lawernce Ion, PA-C Electronically Signed   By: Richarda Overlie M.D.   On: 11/30/2023 17:02   DG Chest Portable 1 View Result Date: 11/30/2023 CLINICAL DATA:  right anterior lower rib  pain s/p fall, eval for fx EXAM: PORTABLE CHEST - 1 VIEW COMPARISON:  09/10/2023 FINDINGS: New small to moderate right pleural effusion, with new atelectasis/consolidation at the right lung base. Left lung remains clear except for a subcentimeter calcified granuloma peripherally in the mid lung field. No pneumothorax. Heart size and mediastinal contours are within normal limits. Visualized bones unremarkable. IMPRESSION: New small to moderate right pleural effusion with right basilar atelectasis/consolidation. Electronically Signed   By: Corlis Leak M.D.   On: 11/30/2023 08:12   CT ABDOMEN PELVIS W CONTRAST Result Date: 11/30/2023 CLINICAL DATA:  Acute, nonlocalized abdominal pain EXAM: CT ABDOMEN AND PELVIS WITH CONTRAST TECHNIQUE: Multidetector CT imaging of the abdomen and pelvis was performed using the standard protocol following bolus administration of intravenous contrast. RADIATION DOSE REDUCTION: This exam was performed according to the departmental dose-optimization program which includes automated exposure control, adjustment of the mA and/or kV according to patient size and/or use of iterative reconstruction technique. CONTRAST:  OMNIPAQUE IOHEXOL 300 MG/ML  SOLN COMPARISON:  09/09/2023 FINDINGS: Lower chest: Small pleural effusions with atelectasis at the right base. Extensive esophageal varices with periesophageal and subserosal enhancement similar to priors. There may be LAD atheromatous calcification. Hepatobiliary: Known cirrhosis without visible mass. The umbilical vein has recanalized. Ascites is large volume with abdominal distension.No evidence of biliary obstruction or stone. Pancreas: Unremarkable. Spleen: Unremarkable. Adrenals/Urinary Tract: Negative adrenals. No hydronephrosis or stone. Unremarkable bladder. Stomach/Bowel:  No obstruction. No appendicitis. Vascular/Lymphatic: Portal hypertension as noted above. Distended celiac axis distally, likely poststenotic dilatation from the  median arcuate ligament. No mass or adenopathy. Reproductive:No pathologic findings. Other: No ascites or pneumoperitoneum. Musculoskeletal: No acute abnormalities. IMPRESSION: 1. Cirrhosis large volume ascites distending the abdomen. 2. Small pleural effusions with multi segment atelectasis in the right lower lobe Electronically Signed   By: Tiburcio Pea M.D.   On: 11/30/2023 04:09   DG Knee Complete 4 Views Left Result Date: 11/30/2023 CLINICAL DATA:  Fall EXAM: LEFT KNEE - COMPLETE 4+ VIEW COMPARISON:  None Available. FINDINGS: No evidence of fracture, dislocation, or joint effusion. No evidence of arthropathy or other focal bone abnormality. Soft tissues are unremarkable. IMPRESSION: Negative. Electronically Signed   By: Deatra Robinson M.D.   On: 11/30/2023 00:48           LOS: 0 days  Time spent= 35 mins    Miguel Rota, MD [facility]  If 7PM-7AM, please contact night-coverage  12/01/2023, 12:19 PM

## 2023-12-01 NOTE — Plan of Care (Signed)
  Problem: Coping: Goal: Level of anxiety will decrease Outcome: Progressing   Problem: Nutrition: Goal: Adequate nutrition will be maintained Outcome: Progressing   Problem: Pain Managment: Goal: General experience of comfort will improve and/or be controlled Outcome: Progressing   Problem: Safety: Goal: Ability to remain free from injury will improve Outcome: Progressing

## 2023-12-01 NOTE — Progress Notes (Signed)
[facility] Progress Note  SUBJECTIVE:   Interval history: Thomas Wyatt was seen and evaluated today at bedside. Resting comfortably in bed. Noted that his abdomen may be filling up with small amount of fluid. He continues to have lower extremity edema, he believes is improved, he has been urinating. He had some nausea this AM with swallowing pills. He was able to tolerate diet. No chest pain or shortness of breath.   Past Medical History:  Diagnosis Date   Liver enzyme elevation    Past Surgical History:  Procedure Laterality Date   LUMBAR DISC SURGERY N/A    Current Facility-Administered Medications  Medication Dose Route Frequency Provider Last Rate Last Admin   acetaminophen (TYLENOL) tablet 650 mg  650 mg Oral Q6H PRN Amin, Ankit C, MD       albumin human 25 % solution 25 g  25 g Intravenous Q6H Amin, Ankit C, MD       enoxaparin (LOVENOX) injection 40 mg  40 mg Subcutaneous Q24H Kirby Crigler, Mir M, MD   40 mg at 11/30/23 2148   folic acid (FOLVITE) tablet 1 mg  1 mg Oral Daily Kirby Crigler, Mir M, MD   1 mg at 12/01/23 1009   furosemide (LASIX) tablet 40 mg  40 mg Oral Daily Liliane Shi H, DO   40 mg at 12/01/23 1009   guaiFENesin (ROBITUSSIN) 100 MG/5ML liquid 5 mL  5 mL Oral Q4H PRN Amin, Ankit C, MD       hydrALAZINE (APRESOLINE) injection 10 mg  10 mg Intravenous Q4H PRN Amin, Ankit C, MD       ibuprofen (ADVIL) tablet 400 mg  400 mg Oral Q6H PRN Kirby Crigler, Mir M, MD   400 mg at 11/30/23 2148   ipratropium-albuterol (DUONEB) 0.5-2.5 (3) MG/3ML nebulizer solution 3 mL  3 mL Nebulization Q4H PRN Amin, Ankit C, MD       lactulose (CHRONULAC) 10 GM/15ML solution 30 g  30 g Oral TID Amin, Ankit C, MD   30 g at 12/01/23 1008   LORazepam (ATIVAN) tablet 1-4 mg  1-4 mg Oral Q1H PRN Amin, Ankit C, MD       Or   LORazepam (ATIVAN) injection 1-4 mg  1-4 mg Intravenous Q1H PRN Amin, Ankit C, MD       metoprolol tartrate (LOPRESSOR) injection 5 mg  5 mg Intravenous Q4H PRN  Amin, Ankit C, MD       midodrine (PROAMATINE) tablet 10 mg  10 mg Oral TID WC Amin, Ankit C, MD       multivitamin with minerals tablet 1 tablet  1 tablet Oral Daily Kirby Crigler, Mir M, MD   1 tablet at 12/01/23 1009   nadolol (CORGARD) tablet 20 mg  20 mg Oral Daily Kirby Crigler, Mir M, MD   20 mg at 12/01/23 1010   ondansetron (ZOFRAN) tablet 4 mg  4 mg Oral Q6H PRN Kirby Crigler, Mir M, MD       Or   ondansetron (ZOFRAN) injection 4 mg  4 mg Intravenous Q6H PRN Kirby Crigler, Mir M, MD       pantoprazole (PROTONIX) EC tablet 40 mg  40 mg Oral Daily Kirby Crigler, Mir M, MD   40 mg at 12/01/23 1009   senna-docusate (Senokot-S) tablet 1 tablet  1 tablet Oral QHS PRN Amin, Ankit C, MD       spironolactone (ALDACTONE) tablet 100 mg  100 mg Oral Daily Liliane Shi H, DO   100 mg at 11/30/23 1811  thiamine (VITAMIN B1) tablet 100 mg  100 mg Oral Daily Kirby Crigler, Mir M, MD   100 mg at 12/01/23 1009   traZODone (DESYREL) tablet 25 mg  25 mg Oral QHS PRN Maryln Gottron, MD       Allergies as of 11/29/2023   (No Known Allergies)   Review of Systems:  Review of Systems  Respiratory:  Negative for shortness of breath.   Cardiovascular:  Positive for leg swelling. Negative for chest pain.  Gastrointestinal:  Positive for nausea. Negative for abdominal pain and vomiting.    OBJECTIVE:   Temp:  [97.6 F (36.4 C)-98.7 F (37.1 C)] 98.7 F (37.1 C) (02/02 1009) Pulse Rate:  [66-78] 66 (02/02 1009) Resp:  [15-18] 16 (02/02 1009) BP: (85-105)/(50-75) 85/50 (02/02 1009) SpO2:  [96 %-99 %] 96 % (02/02 1009) Weight:  [66.2 kg] 66.2 kg (02/01 1742) Last BM Date : 12/01/23 Physical Exam Constitutional:      General: He is not in acute distress.    Appearance: He is not ill-appearing, toxic-appearing or diaphoretic.  Cardiovascular:     Rate and Rhythm: Normal rate and regular rhythm.  Pulmonary:     Effort: No respiratory distress.     Breath sounds: Normal breath sounds.  Abdominal:      General: Bowel sounds are normal. There is distension (mild).     Palpations: Abdomen is soft.     Tenderness: There is no abdominal tenderness. There is no guarding.  Musculoskeletal:     Right lower leg: Edema (slightly improved, to mid thigh) present.     Left lower leg: Edema (slightly improved, to mid thigh) present.  Skin:    General: Skin is warm and dry.  Neurological:     Mental Status: He is alert.     Labs: Recent Labs    11/30/23 0122 12/01/23 0339  WBC 9.6 7.9  HGB 11.7* 11.2*  HCT 35.6* 34.0*  PLT 174 152   BMET Recent Labs    11/30/23 0122 12/01/23 0339  NA 134* 132*  K 3.0* 3.3*  CL 101 99  CO2 26 27  GLUCOSE 107* 68*  BUN 6 7  CREATININE 0.74 0.69  CALCIUM 7.9* 7.4*   LFT Recent Labs    12/01/23 0339  PROT 4.9*  ALBUMIN 1.5*  AST 31  ALT 16  ALKPHOS 90  BILITOT 2.9*   PT/INR Recent Labs    11/30/23 0122  LABPROT 16.9*  INR 1.4*   Diagnostic imaging: US Paracentesis Result Date: 11/30/2023 INDICATION: 28 year old male with recent diagnosis of alcoholic cirrhosis presents with abdominal distension, previous imaging showed ascites. Request for therapeutic and diagnostic paracentesis. EXAM: ULTRASOUND GUIDED  PARACENTESIS MEDICATIONS: 8 mL 1% lidocaine COMPLICATIONS: None immediate. PROCEDURE: Informed written consent was obtained from the patient after a discussion of the risks, benefits and alternatives to treatment. A timeout was performed prior to the initiation of the procedure. Initial ultrasound scanning demonstrates a large amount of ascites within the right lower abdominal quadrant. The right lower abdomen was prepped and draped in the usual sterile fashion. 1% lidocaine was used for local anesthesia. Following this, a 19 gauge, 7-cm, Yueh catheter was introduced. An ultrasound image was saved for documentation purposes. The paracentesis was performed. The catheter was removed and a dressing was applied. The patient tolerated the procedure  well without immediate post procedural complication. Per EMR, patient is refusing blood products. FINDINGS: A total of approximately 7.3 L of hazy yellow fluid was removed. Samples were sent  to the laboratory as requested by the clinical team. IMPRESSION: Successful ultrasound-guided paracentesis yielding 7.3 liters of peritoneal fluid. PLAN: If the patient eventually requires >/=2 paracenteses in a 30 day period, candidacy for formal evaluation by the [facility] Interventional Radiology Portal Hypertension Clinic will be assessed. Performed by: Lawernce Ion, PA-C Electronically Signed   By: Richarda Overlie M.D.   On: 11/30/2023 17:02   DG Chest Portable 1 View Result Date: 11/30/2023 CLINICAL DATA:  right anterior lower rib pain s/p fall, eval for fx EXAM: PORTABLE CHEST - 1 VIEW COMPARISON:  09/10/2023 FINDINGS: New small to moderate right pleural effusion, with new atelectasis/consolidation at the right lung base. Left lung remains clear except for a subcentimeter calcified granuloma peripherally in the mid lung field. No pneumothorax. Heart size and mediastinal contours are within normal limits. Visualized bones unremarkable. IMPRESSION: New small to moderate right pleural effusion with right basilar atelectasis/consolidation. Electronically Signed   By: Corlis Leak M.D.   On: 11/30/2023 08:12   CT ABDOMEN PELVIS W CONTRAST Result Date: 11/30/2023 CLINICAL DATA:  Acute, nonlocalized abdominal pain EXAM: CT ABDOMEN AND PELVIS WITH CONTRAST TECHNIQUE: Multidetector CT imaging of the abdomen and pelvis was performed using the standard protocol following bolus administration of intravenous contrast. RADIATION DOSE REDUCTION: This exam was performed according to the departmental dose-optimization program which includes automated exposure control, adjustment of the mA and/or kV according to patient size and/or use of iterative reconstruction technique. CONTRAST:  OMNIPAQUE IOHEXOL 300 MG/ML  SOLN COMPARISON:   09/09/2023 FINDINGS: Lower chest: Small pleural effusions with atelectasis at the right base. Extensive esophageal varices with periesophageal and subserosal enhancement similar to priors. There may be LAD atheromatous calcification. Hepatobiliary: Known cirrhosis without visible mass. The umbilical vein has recanalized. Ascites is large volume with abdominal distension.No evidence of biliary obstruction or stone. Pancreas: Unremarkable. Spleen: Unremarkable. Adrenals/Urinary Tract: Negative adrenals. No hydronephrosis or stone. Unremarkable bladder. Stomach/Bowel:  No obstruction. No appendicitis. Vascular/Lymphatic: Portal hypertension as noted above. 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 No mass or adenopathy. Reproductive:No pathologic findings. Other: No ascites or pneumoperitoneum. Musculoskeletal: No acute abnormalities. IMPRESSION: 1. Cirrhosis large volume ascites distending the abdomen. 2. Small pleural effusions with multi segment atelectasis in the right lower lobe Electronically Signed   By: Tiburcio Pea M.D.   On: 11/30/2023 04:09   DG Knee Complete 4 Views Left Result Date: 11/30/2023 CLINICAL DATA:  Fall EXAM: LEFT KNEE - COMPLETE 4+ VIEW COMPARISON:  None Available. FINDINGS: No evidence of fracture, dislocation, or joint effusion. No evidence of arthropathy or other focal bone abnormality. Soft tissues are unremarkable. IMPRESSION: Negative. Electronically Signed   By: Deatra Robinson M.D.   On: 11/30/2023 00:48   IMPRESSION: Large volume ascites, s/p paracentesis 11/30/23 Bilateral lower extremity edema, mild improvement Cirrhosis secondary to alcohol use decompensated by ascites and likely esophageal varices, MELD 3.0: 19 at 11/30/2023  1:22 AM             -Alcoholic hepatitis 08/2023 s/p treatment with prednisolone Alcohol misuse, in remission x 12 weeks Protein calorie malnutrition secondary to #3 and  #4 Thrombocytopenia Hyperbilirubinemia, improved from prior Normocytic anemia  PLAN: -Discussed with Dr. Nelson Chimes, mild asymptomatic hypotension, plan to hold beta blocker therapy, plan to trial IV albumin, plan to start midodrine  -Continue furosemide 40 mg PO daily and spironolactone 100 mg PO daily  -Strict intake/output, low sodium diet -Recommend outpatient hepatitis A and B vaccination -Recommend outpatient follow up  with Dr. Levora Angel, needs to have outpatient EGD coordinated to evaluate for esophageal varices  -[facility] will follow, Dr. Ewing Schlein to see tomorrow    LOS: 0 days   Liliane Shi, DO [facility]

## 2023-12-02 DIAGNOSIS — K7031 Alcoholic cirrhosis of liver with ascites: Secondary | ICD-10-CM

## 2023-12-02 LAB — CBC
HCT: 30.5 % — ABNORMAL LOW (ref 39.0–52.0)
Hemoglobin: 10.2 g/dL — ABNORMAL LOW (ref 13.0–17.0)
MCH: 32.1 pg (ref 26.0–34.0)
MCHC: 33.4 g/dL (ref 30.0–36.0)
MCV: 95.9 fL (ref 80.0–100.0)
Platelets: 128 10*3/uL — ABNORMAL LOW (ref 150–400)
RBC: 3.18 MIL/uL — ABNORMAL LOW (ref 4.22–5.81)
RDW: 16.7 % — ABNORMAL HIGH (ref 11.5–15.5)
WBC: 5.7 10*3/uL (ref 4.0–10.5)
nRBC: 0 % (ref 0.0–0.2)

## 2023-12-02 LAB — COMPREHENSIVE METABOLIC PANEL
ALT: 13 U/L (ref 0–44)
AST: 26 U/L (ref 15–41)
Albumin: 2.5 g/dL — ABNORMAL LOW (ref 3.5–5.0)
Alkaline Phosphatase: 79 U/L (ref 38–126)
Anion gap: 8 (ref 5–15)
BUN: 8 mg/dL (ref 6–20)
CO2: 23 mmol/L (ref 22–32)
Calcium: 7.9 mg/dL — ABNORMAL LOW (ref 8.9–10.3)
Chloride: 105 mmol/L (ref 98–111)
Creatinine, Ser: 0.46 mg/dL — ABNORMAL LOW (ref 0.61–1.24)
GFR, Estimated: >60 mL/min (ref >60)
Glucose, Bld: 95 mg/dL (ref 70–99)
Potassium: 3.3 mmol/L — ABNORMAL LOW (ref 3.5–5.1)
Sodium: 136 mmol/L (ref 135–145)
Total Bilirubin: 3 mg/dL — ABNORMAL HIGH (ref 0.0–1.2)
Total Protein: 5 g/dL — ABNORMAL LOW (ref 6.5–8.1)

## 2023-12-02 LAB — GRAM STAIN: Gram Stain: NONE SEEN

## 2023-12-02 LAB — PATHOLOGIST SMEAR REVIEW

## 2023-12-02 LAB — MAGNESIUM: Magnesium: 2.1 mg/dL (ref 1.7–2.4)

## 2023-12-02 MED ORDER — MIDODRINE HCL 5 MG PO TABS
15.0000 mg | ORAL_TABLET | Freq: Three times a day (TID) | ORAL | Status: DC
  Administered 2023-12-02 – 2023-12-05 (×11): 15 mg via ORAL
  Filled 2023-12-02 (×12): qty 3

## 2023-12-02 MED ORDER — POTASSIUM CHLORIDE CRYS ER 20 MEQ PO TBCR
40.0000 meq | EXTENDED_RELEASE_TABLET | ORAL | Status: AC
  Administered 2023-12-02 (×2): 40 meq via ORAL
  Filled 2023-12-02 (×2): qty 2

## 2023-12-02 NOTE — Plan of Care (Signed)
   Problem: Education: Goal: Knowledge of General Education information will improve Description: Including pain rating scale, medication(s)/side effects and non-pharmacologic comfort measures Outcome: Progressing   Problem: Activity: Goal: Risk for activity intolerance will decrease Outcome: Progressing   Problem: Coping: Goal: Level of anxiety will decrease Outcome: Progressing   Problem: Safety: Goal: Ability to remain [place] from injury will improve Outcome: Progressing

## 2023-12-02 NOTE — Progress Notes (Signed)
Pt encouraged to walk in a  hallway multiple times, but pt refused. He said "he was too tired and want to rest ". Pt walked in the room.

## 2023-12-02 NOTE — Progress Notes (Signed)
PROGRESS NOTE    Thomas Wyatt  ZHY:[id number] DOB: [birth year] DOA: 11/30/2023 PCP: Genia Hotter, FNP    Brief Narrative:   28 year old with history of tobacco use, heavy alcohol abuse with liver cirrhosis diagnosed in November 2024 admitted for ascites.  [facility] was consulted.  Patient underwent paracentesis, 7.3 L was removed by IR on 2/1.  CT abdomen pelvis shows small pleural effusion, extensive esophageal varices, large ascites.  Assessment & Plan:  Principal Problem:   Liver cirrhosis (HCC)    Decompensated alcoholic liver cirrhosis Ascites Large esophageal varices - Status post large-volume paracentesis 7.3 L removed.  Followed by [facility].  Low-salt diet.  Lasix and Aldactone as tolerated.  Midodrine 10 mg 3 times daily.  Receiving total 6 doses of albumin.  I think he will likely need another paracentesis closer to his discharge.  Will discuss with GI to see if he is a good candidate for TIPS.  Currently difficulty manage with diuretics given his soft blood pressures - Hold off on nadolol for now -Patient will need endoscopic evaluation and likely prophylactic banding for esophageal varices but I will defer this to [facility].   Mild hepatic encephalopathy - Lactulose.  Hypokalemia -As needed repletion. - Mag and Phos normal  Alcohol abuse - Alcohol withdrawal protocol.  Multivitamin, folic acid and thiamine ordered  DVT prophylaxis:Lovenox    Code Status: Full Code Continue hospital stay for management of decompensated liver cirrhosis    Subjective: Systolic blood pressure remains in 80s despite of midodrine and albumin. Also tells me his abdomen is slightly more distended today.  No other complaints.   Examination:  General exam: Appears calm and comfortable  Respiratory system: Clear to auscultation. Respiratory effort normal. Cardiovascular system: S1 & S2 heard, RRR. No JVD, murmurs, rubs, gallops or clicks. No pedal edema. Gastrointestinal system:  Distended abdomen with positive fluid wave shift.  It is still not as tense yet Central nervous system: Alert and oriented. No focal neurological deficits. Extremities: Symmetric 5 x 5 power. Skin: No rashes, lesions or ulcers Psychiatry: Judgement and insight appear normal. Mood & affect appropriate.                Diet Orders (From admission, onward)     Start     Ordered   11/30/23 1206  Diet 2 gram sodium Room service appropriate? Yes; Fluid consistency: Thin  Diet effective now       Question Answer Comment  Room service appropriate? Yes   Fluid consistency: Thin      11/30/23 1205            Objective: Vitals:   12/01/23 2100 12/02/23 0300 12/02/23 0550 12/02/23 0844  BP: (!) 88/45  (!) 86/44 (!) 90/45  Pulse: 60 (!) 58 74   Resp: 19  19   Temp: 97.9 F (36.6 C)  98.5 F (36.9 C)   TempSrc: Oral  Oral   SpO2: 100%  95%   Weight:      Height:        Intake/Output Summary (Last 24 hours) at 12/02/2023 0907 Last data filed at 12/02/2023 0600 Gross per 24 hour  Intake 840.12 ml  Output --  Net 840.12 ml   Filed Weights   11/30/23 0007 11/30/23 1742  Weight: 66.2 kg 66.2 kg    Scheduled Meds:  enoxaparin (LOVENOX) injection  40 mg Subcutaneous Q24H   folic acid  1 mg Oral Daily   furosemide  40 mg Oral  Daily   lactulose  30 g Oral TID   midodrine  15 mg Oral TID WC   multivitamin with minerals  1 tablet Oral Daily   pantoprazole  40 mg Oral Daily   potassium chloride  40 mEq Oral Q4H   spironolactone  100 mg Oral Daily   thiamine  100 mg Oral Daily   Continuous Infusions:  albumin human 25 g (12/02/23 1610)    Nutritional status     Body mass index is 22.2 kg/m.  Data Reviewed:   CBC: Recent Labs  Lab 11/30/23 0122 12/01/23 0339 12/02/23 0320  WBC 9.6 7.9 5.7  HGB 11.7* 11.2* 10.2*  HCT 35.6* 34.0* 30.5*  MCV 97.3 97.7 95.9  PLT 174 152 128*   Basic Metabolic Panel: Recent Labs  Lab 11/30/23 0122 12/01/23 0339  12/02/23 0320  NA 134* 132* 136  K 3.0* 3.3* 3.3*  CL 101 99 105  CO2 26 27 23   GLUCOSE 107* 68* 95  BUN 6 7 8   CREATININE 0.74 0.69 0.46*  CALCIUM 7.9* 7.4* 7.9*  MG  --  2.0 2.1  PHOS  --  2.5  --    GFR: Estimated Creatinine Clearance: 128.7 mL/min (A) (by C-G formula based on SCr of 0.46 mg/dL (L)). Liver Function Tests: Recent Labs  Lab 11/30/23 0122 12/01/23 0339 12/02/23 0320  AST 38 31 26  ALT 18 16 13   ALKPHOS 108 90 79  BILITOT 3.4* 2.9* 3.0*  PROT 6.0* 4.9* 5.0*  ALBUMIN 1.9* 1.5* 2.5*   No results for input(s): "LIPASE", "AMYLASE" in the last 168 hours. Recent Labs  Lab 11/30/23 0800  AMMONIA 51*   Coagulation Profile: Recent Labs  Lab 11/30/23 0122  INR 1.4*   Cardiac Enzymes: No results for input(s): "CKTOTAL", "CKMB", "CKMBINDEX", "TROPONINI" in the last 168 hours. BNP (last 3 results) No results for input(s): "PROBNP" in the last 8760 hours. HbA1C: No results for input(s): "HGBA1C" in the last 72 hours. CBG: No results for input(s): "GLUCAP" in the last 168 hours. Lipid Profile: No results for input(s): "CHOL", "HDL", "LDLCALC", "TRIG", "CHOLHDL", "LDLDIRECT" in the last 72 hours. Thyroid Function Tests: No results for input(s): "TSH", "T4TOTAL", "FREET4", "T3FREE", "THYROIDAB" in the last 72 hours. Anemia Panel: No results for input(s): "VITAMINB12", "FOLATE", "FERRITIN", "TIBC", "IRON", "RETICCTPCT" in the last 72 hours. Sepsis Labs: No results for input(s): "PROCALCITON", "LATICACIDVEN" in the last 168 hours.  Recent Results (from the past 240 hours)  Culture, body fluid w Gram Stain-bottle     Status: None (Preliminary result)   Collection Time: 11/30/23 11:20 AM   Specimen: Path fluid  Result Value Ref Range Status   Specimen Description FLUID  Final   Special Requests NONE  Final   Culture   Final    NO GROWTH 2 DAYS Performed at [facility], [address], [place], [place] [address]    Report Status PENDING  Incomplete   Gram stain     Status: None (Preliminary result)   Collection Time: 11/30/23 11:20 AM   Specimen: Path fluid  Result Value Ref Range Status   Specimen Description FLUID  Final   Special Requests NONE  Final   Gram Stain   Final    NO WBC SEEN NO ORGANISMS SEEN Performed at [facility], [address] [place], [place] [address]    Report Status PENDING  Incomplete         Radiology Studies: US Paracentesis Result Date: 11/30/2023 INDICATION:  28 year old male with recent diagnosis of alcoholic cirrhosis presents with abdominal distension, previous imaging showed ascites. Request for therapeutic and diagnostic paracentesis. EXAM: ULTRASOUND GUIDED  PARACENTESIS MEDICATIONS: 8 mL 1% lidocaine COMPLICATIONS: None immediate. PROCEDURE: Informed written consent was obtained from the patient after a discussion of the risks, benefits and alternatives to treatment. A timeout was performed prior to the initiation of the procedure. Initial ultrasound scanning demonstrates a large amount of ascites within the right lower abdominal quadrant. The right lower abdomen was prepped and draped in the usual sterile fashion. 1% lidocaine was used for local anesthesia. Following this, a 19 gauge, 7-cm, Yueh catheter was introduced. An ultrasound image was saved for documentation purposes. The paracentesis was performed. The catheter was removed and a dressing was applied. The patient tolerated the procedure well without immediate post procedural complication. Per EMR, patient is refusing blood products. FINDINGS: A total of approximately 7.3 L of hazy yellow fluid was removed. Samples were sent to the laboratory as requested by the clinical team. IMPRESSION: Successful ultrasound-guided paracentesis yielding 7.3 liters of peritoneal fluid. PLAN: If the patient eventually requires >/=2 paracenteses in a 30 day period, candidacy for formal evaluation by the [facility] Interventional Radiology Portal Hypertension  Clinic will be assessed. Performed by: Lawernce Ion, PA-C Electronically Signed   By: Richarda Overlie M.D.   On: 11/30/2023 17:02           LOS: 1 day   Time spent= 35 mins    Miguel Rota, MD [facility]  If 7PM-7AM, please contact night-coverage  12/02/2023, 9:07 AM

## 2023-12-02 NOTE — Plan of Care (Signed)
  Problem: Coping: Goal: Level of anxiety will decrease Outcome: Progressing   Problem: Pain Managment: Goal: General experience of comfort will improve and/or be controlled Outcome: Progressing   Problem: Safety: Goal: Ability to remain free from injury will improve Outcome: Progressing

## 2023-12-02 NOTE — Progress Notes (Signed)
Thomas Wyatt 1:29 PM  Subjective: Patient seen and examined and his hospital computer chart reviewed and his case discussed with my partner Dr. Lorenso Wyatt and he says he stopped drinking 10 to 12 weeks ago and just recently developed ascites and this was his first paracentesis and other than it starting to reaccumulate and pain from falling on his rib and coughing from his recent infection he has no other complaints  Objective: Vital signs stable afebrile blood pressure little low abdomen with obvious ascites nontender soft minimal pedal edema BUN and creatinine okay bili minimal elevation much improved since November hemoglobin slight drop in platelets a little low INR minimally elevated CT reviewed  Assessment: Alcoholic cirrhosis complicated with ascites  Plan: Although he might need TIPS in the future okay to hold off for now since he was just diagnosed recently and as per Dr. Lorenso Wyatt our partner Dr. Levora Wyatt will see back in the office for follow-up and to adjust diuretics and proceed with an endoscopy as an outpatient and I cautioned him about aspirin and nonsteroidals as an outpatient and would use no more than 4 Tylenol a day and although his blood pressure may be a little low I think if he can walk without dizziness or shortness of breath he probably can be discharged soon with at least a trial of diuretics follow-up as above and please call me if I could be of any further assistance with this hospital stay and he might need a another paracentesis prior to discharge as per your note and I would send the fluid for albumin as well to confirm a transudate and I am not sure he truly requires such a high dose of lactulose and clearly no signs of encephalopathy currently [name]  office [phone] After 5PM or if no answer call [phone]

## 2023-12-03 DIAGNOSIS — K7031 Alcoholic cirrhosis of liver with ascites: Secondary | ICD-10-CM | POA: Diagnosis not present

## 2023-12-03 LAB — COMPREHENSIVE METABOLIC PANEL
ALT: 12 U/L (ref 0–44)
AST: 26 U/L (ref 15–41)
Albumin: 2.5 g/dL — ABNORMAL LOW (ref 3.5–5.0)
Alkaline Phosphatase: 68 U/L (ref 38–126)
Anion gap: 8 (ref 5–15)
BUN: 7 mg/dL (ref 6–20)
CO2: 25 mmol/L (ref 22–32)
Calcium: 8.2 mg/dL — ABNORMAL LOW (ref 8.9–10.3)
Chloride: 104 mmol/L (ref 98–111)
Creatinine, Ser: 0.74 mg/dL (ref 0.61–1.24)
GFR, Estimated: >60 mL/min (ref >60)
Glucose, Bld: 99 mg/dL (ref 70–99)
Potassium: 3.8 mmol/L (ref 3.5–5.1)
Sodium: 137 mmol/L (ref 135–145)
Total Bilirubin: 2.3 mg/dL — ABNORMAL HIGH (ref 0.0–1.2)
Total Protein: 4.8 g/dL — ABNORMAL LOW (ref 6.5–8.1)

## 2023-12-03 LAB — CBC
HCT: 31.4 % — ABNORMAL LOW (ref 39.0–52.0)
Hemoglobin: 10.1 g/dL — ABNORMAL LOW (ref 13.0–17.0)
MCH: 32.1 pg (ref 26.0–34.0)
MCHC: 32.2 g/dL (ref 30.0–36.0)
MCV: 99.7 fL (ref 80.0–100.0)
Platelets: 130 10*3/uL — ABNORMAL LOW (ref 150–400)
RBC: 3.15 MIL/uL — ABNORMAL LOW (ref 4.22–5.81)
RDW: 17 % — ABNORMAL HIGH (ref 11.5–15.5)
WBC: 7.2 10*3/uL (ref 4.0–10.5)
nRBC: 0 % (ref 0.0–0.2)

## 2023-12-03 LAB — MAGNESIUM: Magnesium: 2 mg/dL (ref 1.7–2.4)

## 2023-12-03 MED ORDER — NICOTINE 7 MG/24HR TD PT24
7.0000 mg | MEDICATED_PATCH | Freq: Every day | TRANSDERMAL | Status: AC
  Administered 2023-12-03: 7 mg via TRANSDERMAL
  Filled 2023-12-03: qty 1

## 2023-12-03 NOTE — Progress Notes (Signed)
 PROGRESS NOTE    KVION SHAPLEY  FMW:[id number] DOB: [birth year] DOA: 11/30/2023 PCP: Crecencio Chiquita POUR, FNP    Brief Narrative:   28 year old with history of tobacco use, heavy alcohol abuse with liver cirrhosis diagnosed in November 2024 admitted for ascites.  [facility] was consulted.  Patient underwent paracentesis, 7.3 L was removed by IR on 2/1.  CT abdomen pelvis shows small pleural effusion, extensive esophageal varices, large ascites. Currently patient with soft blood pressure therefore ongoing management between diuretics and midodrine .  Assessment & Plan:  Principal Problem:   Liver cirrhosis (HCC)    Decompensated alcoholic liver cirrhosis Ascites Large esophageal varices - Status post large-volume paracentesis 7.3 L removed.  Currently due to soft blood pressure patient is on midodrine .  Also received albumin .  Will require another paracentesis closer to discharge.  Depending on blood pressure, diuretic response and repeated need for paracentesis, he may benefit from TIPS.  This can be determined outpatient. -Patient will need endoscopic evaluation and likely prophylactic banding for esophageal varices but I will defer this to [facility].   Mild hepatic encephalopathy - Lactulose .  Hypokalemia -As needed repletion. - Mag and Phos normal  Alcohol abuse - Alcohol withdrawal protocol.  Multivitamin, folic acid  and thiamine  ordered  DVT prophylaxis:Lovenox     Code Status: Full Code Continue hospital stay for management of decompensated liver cirrhosis    Subjective: Systolic blood pressure slowly improving in 90s. Overall doing better.  Agreeable for likely paracentesis tomorrow  Examination:  General exam: Appears calm and comfortable  Respiratory system: Clear to auscultation. Respiratory effort normal. Cardiovascular system: S1 & S2 heard, RRR. No JVD, murmurs, rubs, gallops or clicks. No pedal edema. Gastrointestinal system: Distended abdomen with positive fluid  wave shift.  It is still not as tense yet Central nervous system: Alert and oriented. No focal neurological deficits. Extremities: Symmetric 5 x 5 power. Skin: No rashes, lesions or ulcers Psychiatry: Judgement and insight appear normal. Mood & affect appropriate.                Diet Orders (From admission, onward)     Start     Ordered   11/30/23 1206  Diet 2 gram sodium Room service appropriate? Yes; Fluid consistency: Thin  Diet effective now       Question Answer Comment  Room service appropriate? Yes   Fluid consistency: Thin      11/30/23 1205            Objective: Vitals:   12/02/23 0844 12/02/23 1305 12/02/23 2114 12/03/23 0546  BP: (!) 90/45 (!) 86/48 (!) 93/43 (!) 94/57  Pulse:  71 (!) 55 63  Resp:  15 17 17   Temp:  98 F (36.7 C) 98.4 F (36.9 C) 98.3 F (36.8 C)  TempSrc:   Oral Oral  SpO2:  97% 97% 96%  Weight:      Height:        Intake/Output Summary (Last 24 hours) at 12/03/2023 1107 Last data filed at 12/03/2023 0200 Gross per 24 hour  Intake 449.88 ml  Output --  Net 449.88 ml   Filed Weights   11/30/23 0007 11/30/23 1742  Weight: 66.2 kg 66.2 kg    Scheduled Meds:  enoxaparin  (LOVENOX ) injection  40 mg Subcutaneous Q24H   folic acid   1 mg Oral Daily   furosemide   40 mg Oral Daily   midodrine   15 mg Oral TID WC   multivitamin with minerals  1 tablet Oral Daily  pantoprazole   40 mg Oral Daily   spironolactone   100 mg Oral Daily   thiamine   100 mg Oral Daily   Continuous Infusions:  Nutritional status     Body mass index is 22.2 kg/m.  Data Reviewed:   CBC: Recent Labs  Lab 11/30/23 0122 12/01/23 0339 12/02/23 0320 12/03/23 0341  WBC 9.6 7.9 5.7 7.2  HGB 11.7* 11.2* 10.2* 10.1*  HCT 35.6* 34.0* 30.5* 31.4*  MCV 97.3 97.7 95.9 99.7  PLT 174 152 128* 130*   Basic Metabolic Panel: Recent Labs  Lab 11/30/23 0122 12/01/23 0339 12/02/23 0320 12/03/23 0341  NA 134* 132* 136 137  K 3.0* 3.3* 3.3* 3.8  CL 101  99 105 104  CO2 26 27 23 25   GLUCOSE 107* 68* 95 99  BUN 6 7 8 7   CREATININE 0.74 0.69 0.46* 0.74  CALCIUM 7.9* 7.4* 7.9* 8.2*  MG  --  2.0 2.1 2.0  PHOS  --  2.5  --   --    GFR: Estimated Creatinine Clearance: 128.7 mL/min (by C-G formula based on SCr of 0.74 mg/dL). Liver Function Tests: Recent Labs  Lab 11/30/23 0122 12/01/23 0339 12/02/23 0320 12/03/23 0341  AST 38 31 26 26   ALT 18 16 13 12   ALKPHOS 108 90 79 68  BILITOT 3.4* 2.9* 3.0* 2.3*  PROT 6.0* 4.9* 5.0* 4.8*  ALBUMIN  1.9* 1.5* 2.5* 2.5*   No results for input(s): LIPASE, AMYLASE in the last 168 hours. Recent Labs  Lab 11/30/23 0800  AMMONIA 51*   Coagulation Profile: Recent Labs  Lab 11/30/23 0122  INR 1.4*   Cardiac Enzymes: No results for input(s): CKTOTAL, CKMB, CKMBINDEX, TROPONINI in the last 168 hours. BNP (last 3 results) No results for input(s): PROBNP in the last 8760 hours. HbA1C: No results for input(s): HGBA1C in the last 72 hours. CBG: No results for input(s): GLUCAP in the last 168 hours. Lipid Profile: No results for input(s): CHOL, HDL, LDLCALC, TRIG, CHOLHDL, LDLDIRECT in the last 72 hours. Thyroid Function Tests: No results for input(s): TSH, T4TOTAL, FREET4, T3FREE, THYROIDAB in the last 72 hours. Anemia Panel: No results for input(s): VITAMINB12, FOLATE, FERRITIN, TIBC, IRON, RETICCTPCT in the last 72 hours. Sepsis Labs: No results for input(s): PROCALCITON, LATICACIDVEN in the last 168 hours.  Recent Results (from the past 240 hours)  Culture, body fluid w Gram Stain-bottle     Status: None (Preliminary result)   Collection Time: 11/30/23 11:20 AM   Specimen: Path fluid  Result Value Ref Range Status   Specimen Description FLUID  Final   Special Requests NONE  Final   Culture   Final    NO GROWTH 3 DAYS Performed at [facility], [address] [place], [place] [address]    Report Status PENDING  Incomplete   Gram stain     Status: None   Collection Time: 11/30/23 11:20 AM   Specimen: Path fluid  Result Value Ref Range Status   Specimen Description FLUID  Final   Special Requests NONE  Final   Gram Stain   Final    NO WBC SEEN NO ORGANISMS SEEN Performed at [facility], [address], [place], [place] [address]    Report Status 12/02/2023 FINAL  Final         Radiology Studies: No results found.         LOS: 2 days   Time spent= 35 mins    Burgess JAYSON Dare, MD [facility]  If 7PM-7AM, please contact night-coverage  12/03/2023, 11:07 AM

## 2023-12-03 NOTE — TOC Transition Note (Signed)
 Transition of Care [facility]) - Discharge Note   Patient Details  Name: Thomas Wyatt MRN: [id number] Date of Birth: 22-Nov-1995  [facility]) CM/SW Contact:  NORMAN ASPEN, LCSW Phone Number: 12/03/2023, 3:35 PM   Clinical Narrative:     Received TOC referral for substance abuse counseling/ screening.  Met with pt today who was very pleasant and open to talking about his ETOH use.  He fully understands that current medical issues are due to heavy ETOH use but notes that he had stopped drinking about 2-3 months ago.  He states he is determined not to resume use and declines any resource information be provided.  Pt states, I appreciate it very much but I don't want it.  Verbally reviewed some local resources with pt.  No further TOC needs noted.  Will sign off.  Final next level of care: Home/Self Care Barriers to Discharge: Continued Medical Work up   Patient Goals and CMS Choice Patient states their goals for this hospitalization and ongoing recovery are:: return home          Discharge Placement                       Discharge Plan and Services Additional resources added to the After Visit Summary for                  DME Arranged: N/A DME Agency: NA                  Social Drivers of Health (SDOH) Interventions SDOH Screenings   Food Insecurity: Patient Declined (11/30/2023)  Housing: Patient Declined (11/30/2023)  Transportation Needs: Patient Declined (11/30/2023)  Utilities: Patient Declined (11/30/2023)  Social Connections: Patient Declined (11/30/2023)  Tobacco Use: High Risk (11/30/2023)     Readmission Risk Interventions    12/03/2023    3:34 PM  Readmission Risk Prevention Plan  Post Dischage Appt Complete  Medication Screening Complete  Transportation Screening Complete

## 2023-12-03 NOTE — Plan of Care (Signed)
  Problem: Education: Goal: Knowledge of General Education information will improve Description: Including pain rating scale, medication(s)/side effects and non-pharmacologic comfort measures Outcome: Progressing   Problem: Health Behavior/Discharge Planning: Goal: Ability to manage health-related needs will improve Outcome: Progressing   Problem: Clinical Measurements: Goal: Ability to maintain clinical measurements within normal limits will improve Outcome: Progressing Goal: Will remain free from infection Outcome: Progressing Goal: Diagnostic test results will improve Outcome: Progressing Goal: Respiratory complications will improve Outcome: Progressing Goal: Cardiovascular complication will be avoided Outcome: Progressing   Problem: Activity: Goal: Risk for activity intolerance will decrease Outcome: Adequate for Discharge   Problem: Nutrition: Goal: Adequate nutrition will be maintained Outcome: Adequate for Discharge   Problem: Coping: Goal: Level of anxiety will decrease Outcome: Progressing   Problem: Elimination: Goal: Will not experience complications related to bowel motility Outcome: Adequate for Discharge Goal: Will not experience complications related to urinary retention Outcome: Adequate for Discharge   Problem: Pain Managment: Goal: General experience of comfort will improve and/or be controlled Outcome: Progressing   Problem: Safety: Goal: Ability to remain free from injury will improve Outcome: Progressing   Problem: Skin Integrity: Goal: Risk for impaired skin integrity will decrease Outcome: Adequate for Discharge

## 2023-12-04 ENCOUNTER — Inpatient Hospital Stay (HOSPITAL_COMMUNITY): Payer: BC Managed Care – PPO

## 2023-12-04 DIAGNOSIS — K7030 Alcoholic cirrhosis of liver without ascites: Secondary | ICD-10-CM | POA: Diagnosis not present

## 2023-12-04 LAB — CBC
HCT: 33.7 % — ABNORMAL LOW (ref 39.0–52.0)
Hemoglobin: 11 g/dL — ABNORMAL LOW (ref 13.0–17.0)
MCH: 32.4 pg (ref 26.0–34.0)
MCHC: 32.6 g/dL (ref 30.0–36.0)
MCV: 99.1 fL (ref 80.0–100.0)
Platelets: 148 10*3/uL — ABNORMAL LOW (ref 150–400)
RBC: 3.4 MIL/uL — ABNORMAL LOW (ref 4.22–5.81)
RDW: 17.2 % — ABNORMAL HIGH (ref 11.5–15.5)
WBC: 6 10*3/uL (ref 4.0–10.5)
nRBC: 0 % (ref 0.0–0.2)

## 2023-12-04 LAB — ALBUMIN, PLEURAL OR PERITONEAL FLUID: Albumin, Fluid: <1.5 g/dL

## 2023-12-04 LAB — BODY FLUID CELL COUNT WITH DIFFERENTIAL
Lymphs, Fluid: 37 %
Monocyte-Macrophage-Serous Fluid: 56 % (ref 50–90)
Neutrophil Count, Fluid: 7 % (ref 0–25)
Total Nucleated Cell Count, Fluid: 272 uL (ref 0–1000)

## 2023-12-04 LAB — LACTATE DEHYDROGENASE, PLEURAL OR PERITONEAL FLUID: LD, Fluid: 33 U/L — ABNORMAL HIGH (ref 3–23)

## 2023-12-04 LAB — COMPREHENSIVE METABOLIC PANEL
ALT: 14 U/L (ref 0–44)
AST: 31 U/L (ref 15–41)
Albumin: 2.6 g/dL — ABNORMAL LOW (ref 3.5–5.0)
Alkaline Phosphatase: 81 U/L (ref 38–126)
Anion gap: 8 (ref 5–15)
BUN: 10 mg/dL (ref 6–20)
CO2: 26 mmol/L (ref 22–32)
Calcium: 8 mg/dL — ABNORMAL LOW (ref 8.9–10.3)
Chloride: 102 mmol/L (ref 98–111)
Creatinine, Ser: 0.69 mg/dL (ref 0.61–1.24)
GFR, Estimated: >60 mL/min (ref >60)
Glucose, Bld: 88 mg/dL (ref 70–99)
Potassium: 3.2 mmol/L — ABNORMAL LOW (ref 3.5–5.1)
Sodium: 136 mmol/L (ref 135–145)
Total Bilirubin: 2 mg/dL — ABNORMAL HIGH (ref 0.0–1.2)
Total Protein: 5.5 g/dL — ABNORMAL LOW (ref 6.5–8.1)

## 2023-12-04 LAB — GLUCOSE, PLEURAL OR PERITONEAL FLUID: Glucose, Fluid: 93 mg/dL

## 2023-12-04 LAB — PROTEIN, PLEURAL OR PERITONEAL FLUID: Total protein, fluid: <3 g/dL

## 2023-12-04 LAB — MAGNESIUM: Magnesium: 2.1 mg/dL (ref 1.7–2.4)

## 2023-12-04 MED ORDER — LIDOCAINE HCL 1 % IJ SOLN
INTRAMUSCULAR | Status: AC
  Filled 2023-12-04: qty 20

## 2023-12-04 MED ORDER — POTASSIUM CHLORIDE CRYS ER 20 MEQ PO TBCR
40.0000 meq | EXTENDED_RELEASE_TABLET | Freq: Once | ORAL | Status: AC
  Administered 2023-12-04: 40 meq via ORAL
  Filled 2023-12-04: qty 2

## 2023-12-04 NOTE — Procedures (Signed)
 PROCEDURE SUMMARY:  Successful US  guided paracentesis from right lateral abdomen.  Yielded 4.8 liters of yellow fluid.  No immediate complications.  Pt tolerated well.   Specimen was sent for labs.  EBL < 5mL  Solmon Selmer Ku PA-C 12/04/2023 12:39 PM

## 2023-12-04 NOTE — Progress Notes (Signed)
 PROGRESS NOTE    Thomas Wyatt  FMW:[id number] DOB: 1996-06-29 DOA: 11/30/2023 PCP: Crecencio Chiquita POUR, FNP    Brief Narrative:   28 year old with history of tobacco use, heavy alcohol abuse with liver cirrhosis diagnosed in November 2024 admitted for ascites.  [facility] was consulted.  Patient underwent paracentesis, 7.3 L was removed by IR on 2/1.  CT abdomen pelvis shows small pleural effusion, extensive esophageal varices, large ascites. Currently patient with soft blood pressure therefore ongoing management between diuretics and midodrine .  Assessment & Plan:  Principal Problem:   Liver cirrhosis (HCC)    Decompensated alcoholic liver cirrhosis Ascites Large esophageal varices - Status post large-volume paracentesis 7.3 L removed.  Currently due to soft blood pressure patient is on midodrine .  Also received albumin .  Plan paracentesis today.  Depending on blood pressure, diuretic response and repeated need for paracentesis, he may benefit from TIPS.  This can be determined outpatient. -Patient will need endoscopic evaluation and likely prophylactic banding for esophageal varices but I will defer this to [facility].   Mild hepatic encephalopathy - Lactulose .  Hypokalemia -As needed repletion. - Mag and Phos normal  Alcohol abuse - Alcohol withdrawal protocol.  Multivitamin, folic acid  and thiamine  ordered  DVT prophylaxis:Lovenox     Code Status: Full Code Continue hospital stay for management of decompensated liver cirrhosis Hoping for DC next 24 hours   Subjective: Feeling okay no complaints Examination:  General exam: Appears calm and comfortable  Respiratory system: Clear to auscultation. Respiratory effort normal. Cardiovascular system: S1 & S2 heard, RRR. No JVD, murmurs, rubs, gallops or clicks. No pedal edema. Gastrointestinal system: Distended abdomen with positive fluid wave shift.  It is still not as tense yet Central nervous system: Alert and oriented. No focal  neurological deficits. Extremities: Symmetric 5 x 5 power. Skin: No rashes, lesions or ulcers Psychiatry: Judgement and insight appear normal. Mood & affect appropriate.                Diet Orders (From admission, onward)     Start     Ordered   11/30/23 1206  Diet 2 gram sodium Room service appropriate? Yes; Fluid consistency: Thin  Diet effective now       Question Answer Comment  Room service appropriate? Yes   Fluid consistency: Thin      11/30/23 1205            Objective: Vitals:   12/03/23 2049 12/04/23 0140 12/04/23 0555 12/04/23 0811  BP: (!) 92/49 (!) 92/47 (!) 96/55 (!) 95/56  Pulse:  60 67 68  Resp: 17 17 18 16   Temp: 98.7 F (37.1 C) 98.7 F (37.1 C) 99.2 F (37.3 C) 98.6 F (37 C)  TempSrc: Oral Oral Oral   SpO2: 98% 98% 97% 96%  Weight:      Height:        Intake/Output Summary (Last 24 hours) at 12/04/2023 1130 Last data filed at 12/03/2023 1700 Gross per 24 hour  Intake 240 ml  Output --  Net 240 ml   Filed Weights   11/30/23 0007 11/30/23 1742  Weight: 66.2 kg 66.2 kg    Scheduled Meds:  enoxaparin  (LOVENOX ) injection  40 mg Subcutaneous Q24H   folic acid   1 mg Oral Daily   furosemide   40 mg Oral Daily   midodrine   15 mg Oral TID WC   multivitamin with minerals  1 tablet Oral Daily   nicotine   7 mg Transdermal Daily   pantoprazole   40 mg Oral Daily   spironolactone   100 mg Oral Daily   thiamine   100 mg Oral Daily   Continuous Infusions:  Nutritional status     Body mass index is 22.2 kg/m.  Data Reviewed:   CBC: Recent Labs  Lab 11/30/23 0122 12/01/23 0339 12/02/23 0320 12/03/23 0341 12/04/23 0336  WBC 9.6 7.9 5.7 7.2 6.0  HGB 11.7* 11.2* 10.2* 10.1* 11.0*  HCT 35.6* 34.0* 30.5* 31.4* 33.7*  MCV 97.3 97.7 95.9 99.7 99.1  PLT 174 152 128* 130* 148*   Basic Metabolic Panel: Recent Labs  Lab 11/30/23 0122 12/01/23 0339 12/02/23 0320 12/03/23 0341 12/04/23 0336  NA 134* 132* 136 137 136  K 3.0* 3.3*  3.3* 3.8 3.2*  CL 101 99 105 104 102  CO2 26 27 23 25 26   GLUCOSE 107* 68* 95 99 88  BUN 6 7 8 7 10   CREATININE 0.74 0.69 0.46* 0.74 0.69  CALCIUM 7.9* 7.4* 7.9* 8.2* 8.0*  MG  --  2.0 2.1 2.0 2.1  PHOS  --  2.5  --   --   --    GFR: Estimated Creatinine Clearance: 128.7 mL/min (by C-G formula based on SCr of 0.69 mg/dL). Liver Function Tests: Recent Labs  Lab 11/30/23 0122 12/01/23 0339 12/02/23 0320 12/03/23 0341 12/04/23 0336  AST 38 31 26 26 31   ALT 18 16 13 12 14   ALKPHOS 108 90 79 68 81  BILITOT 3.4* 2.9* 3.0* 2.3* 2.0*  PROT 6.0* 4.9* 5.0* 4.8* 5.5*  ALBUMIN  1.9* 1.5* 2.5* 2.5* 2.6*   No results for input(s): LIPASE, AMYLASE in the last 168 hours. Recent Labs  Lab 11/30/23 0800  AMMONIA 51*   Coagulation Profile: Recent Labs  Lab 11/30/23 0122  INR 1.4*   Cardiac Enzymes: No results for input(s): CKTOTAL, CKMB, CKMBINDEX, TROPONINI in the last 168 hours. BNP (last 3 results) No results for input(s): PROBNP in the last 8760 hours. HbA1C: No results for input(s): HGBA1C in the last 72 hours. CBG: No results for input(s): GLUCAP in the last 168 hours. Lipid Profile: No results for input(s): CHOL, HDL, LDLCALC, TRIG, CHOLHDL, LDLDIRECT in the last 72 hours. Thyroid Function Tests: No results for input(s): TSH, T4TOTAL, FREET4, T3FREE, THYROIDAB in the last 72 hours. Anemia Panel: No results for input(s): VITAMINB12, FOLATE, FERRITIN, TIBC, IRON, RETICCTPCT in the last 72 hours. Sepsis Labs: No results for input(s): PROCALCITON, LATICACIDVEN in the last 168 hours.  Recent Results (from the past 240 hours)  Culture, body fluid w Gram Stain-bottle     Status: None (Preliminary result)   Collection Time: 11/30/23 11:20 AM   Specimen: Path fluid  Result Value Ref Range Status   Specimen Description FLUID  Final   Special Requests NONE  Final   Culture   Final    NO GROWTH 4 DAYS Performed at [facility], [address] [place], [place] [address]    Report Status PENDING  Incomplete  Gram stain     Status: None   Collection Time: 11/30/23 11:20 AM   Specimen: Path fluid  Result Value Ref Range Status   Specimen Description FLUID  Final   Special Requests NONE  Final   Gram Stain   Final    NO WBC SEEN NO ORGANISMS SEEN Performed at [facility], [address] [place], [place] [address]    Report Status 12/02/2023 FINAL  Final         Radiology Studies: No  results found.         LOS: 3 days   Time spent= 35 mins    Burgess JAYSON Dare, MD [facility]  If 7PM-7AM, please contact night-coverage  12/04/2023, 11:30 AM

## 2023-12-04 NOTE — Plan of Care (Signed)
   Problem: Education: Goal: Knowledge of General Education information will improve Description: Including pain rating scale, medication(s)/side effects and non-pharmacologic comfort measures Outcome: Progressing   Problem: Activity: Goal: Risk for activity intolerance will decrease Outcome: Progressing   Problem: Nutrition: Goal: Adequate nutrition will be maintained Outcome: Progressing

## 2023-12-05 ENCOUNTER — Other Ambulatory Visit (HOSPITAL_COMMUNITY): Payer: Self-pay

## 2023-12-05 DIAGNOSIS — K7030 Alcoholic cirrhosis of liver without ascites: Secondary | ICD-10-CM | POA: Diagnosis not present

## 2023-12-05 LAB — CBC
HCT: 37 % — ABNORMAL LOW (ref 39.0–52.0)
Hemoglobin: 12 g/dL — ABNORMAL LOW (ref 13.0–17.0)
MCH: 32 pg (ref 26.0–34.0)
MCHC: 32.4 g/dL (ref 30.0–36.0)
MCV: 98.7 fL (ref 80.0–100.0)
Platelets: 159 10*3/uL (ref 150–400)
RBC: 3.75 MIL/uL — ABNORMAL LOW (ref 4.22–5.81)
RDW: 17.3 % — ABNORMAL HIGH (ref 11.5–15.5)
WBC: 7.4 10*3/uL (ref 4.0–10.5)
nRBC: 0 % (ref 0.0–0.2)

## 2023-12-05 LAB — COMPREHENSIVE METABOLIC PANEL
ALT: 17 U/L (ref 0–44)
AST: 37 U/L (ref 15–41)
Albumin: 2.6 g/dL — ABNORMAL LOW (ref 3.5–5.0)
Alkaline Phosphatase: 88 U/L (ref 38–126)
Anion gap: 9 (ref 5–15)
BUN: 9 mg/dL (ref 6–20)
CO2: 22 mmol/L (ref 22–32)
Calcium: 8.2 mg/dL — ABNORMAL LOW (ref 8.9–10.3)
Chloride: 102 mmol/L (ref 98–111)
Creatinine, Ser: 0.73 mg/dL (ref 0.61–1.24)
GFR, Estimated: >60 mL/min (ref >60)
Glucose, Bld: 71 mg/dL (ref 70–99)
Potassium: 4 mmol/L (ref 3.5–5.1)
Sodium: 133 mmol/L — ABNORMAL LOW (ref 135–145)
Total Bilirubin: 1.9 mg/dL — ABNORMAL HIGH (ref 0.0–1.2)
Total Protein: 5.6 g/dL — ABNORMAL LOW (ref 6.5–8.1)

## 2023-12-05 LAB — CULTURE, BODY FLUID W GRAM STAIN -BOTTLE: Culture: NO GROWTH

## 2023-12-05 LAB — TRIGLYCERIDES, BODY FLUIDS: Triglycerides, Fluid: 22 mg/dL

## 2023-12-05 LAB — TOTAL BILIRUBIN, BODY FLUID

## 2023-12-05 LAB — PH, BODY FLUID: pH, Body Fluid: 7.5

## 2023-12-05 LAB — MAGNESIUM: Magnesium: 2 mg/dL (ref 1.7–2.4)

## 2023-12-05 MED ORDER — ALBUMIN HUMAN 25 % IV SOLN
25.0000 g | INTRAVENOUS | Status: AC
  Administered 2023-12-05 (×2): 25 g via INTRAVENOUS
  Filled 2023-12-05 (×2): qty 100

## 2023-12-05 MED ORDER — PANTOPRAZOLE SODIUM 40 MG PO TBEC
40.0000 mg | DELAYED_RELEASE_TABLET | Freq: Every day | ORAL | 0 refills | Status: AC
  Filled 2023-12-05: qty 30, 30d supply, fill #0

## 2023-12-05 MED ORDER — FUROSEMIDE 40 MG PO TABS
40.0000 mg | ORAL_TABLET | Freq: Every day | ORAL | 0 refills | Status: AC
  Filled 2023-12-05: qty 30, 30d supply, fill #0

## 2023-12-05 MED ORDER — VITAMIN B-1 100 MG PO TABS
100.0000 mg | ORAL_TABLET | Freq: Every day | ORAL | 0 refills | Status: AC
  Filled 2023-12-05: qty 30, 30d supply, fill #0

## 2023-12-05 MED ORDER — NICOTINE 14 MG/24HR TD PT24
14.0000 mg | MEDICATED_PATCH | Freq: Every day | TRANSDERMAL | Status: DC
  Administered 2023-12-05: 14 mg via TRANSDERMAL
  Filled 2023-12-05: qty 1

## 2023-12-05 MED ORDER — FOLIC ACID 1 MG PO TABS
1.0000 mg | ORAL_TABLET | Freq: Every day | ORAL | 0 refills | Status: AC
  Filled 2023-12-05: qty 30, 30d supply, fill #0

## 2023-12-05 MED ORDER — ADULT MULTIVITAMIN W/MINERALS CH: 1.0000 | ORAL_TABLET | Freq: Every day | ORAL | Status: AC

## 2023-12-05 MED ORDER — SPIRONOLACTONE 100 MG PO TABS
100.0000 mg | ORAL_TABLET | Freq: Every day | ORAL | 0 refills | Status: AC
  Filled 2023-12-05: qty 30, 30d supply, fill #0

## 2023-12-05 MED ORDER — MIDODRINE HCL 5 MG PO TABS
15.0000 mg | ORAL_TABLET | Freq: Three times a day (TID) | ORAL | 0 refills | Status: AC
  Filled 2023-12-05: qty 120, 14d supply, fill #0

## 2023-12-05 NOTE — Discharge Instructions (Signed)
 Follow up with lab work at next pcp appointment (BMP/CBC)

## 2023-12-05 NOTE — Discharge Summary (Signed)
 Physician Discharge Summary  Thomas Wyatt FMW:[id number] DOB: 11-10-1995 DOA: 11/30/2023  PCP: Crecencio Chiquita POUR, FNP  Admit date: 11/30/2023 Discharge date: 12/05/2023  Admitted From: Home Disposition: Home  Recommendations for Outpatient Follow-up:  Follow up with PCP in 1-2 weeks Please obtain BMP/CBC in one week your next doctors visit.  Follow-up with [facility] gastroenterology.  To be arranged by their service.  Their service has been notified Lasix , Aldactone  and midodrine  has been prescribed.  Will be adjusted outpatient as necessary Patient has declined any resources for substance abuse/alcohol use.   Discharge Condition: Stable CODE STATUS: Full code Diet recommendation: Low-salt  Brief/Interim Summary: Brief Narrative:   28 year old with history of tobacco use, heavy alcohol abuse with liver cirrhosis diagnosed in November 2024 admitted for ascites.  [facility] was consulted.  Patient underwent paracentesis, 7.3 L was removed by IR on 2/1.  CT abdomen pelvis shows small pleural effusion, extensive esophageal varices, large ascites. Currently patient with soft blood pressure therefore ongoing management between diuretics and midodrine . Patient underwent paracentesis again on 2/5, 4.8 L was removed.  Received albumin  thereafter. I discussed case with outpatient [facility].  Their service will make outpatient arrangement for routine paracentesis as needed otherwise we will follow-up for further management as mentioned below  Patient is stable for discharge today.  Assessment & Plan:  Principal Problem:   Liver cirrhosis (HCC)    Decompensated alcoholic liver cirrhosis Ascites Large esophageal varices - Status post large-volume paracentesis 7.3 L removed.  Underwent another paracentesis on 2/5 followed by albumin ..  Depending on blood pressure, diuretic response and repeated need for paracentesis, he may benefit from TIPS.  This can be determined outpatient.  Currently we are not able  to further uptitrate his diuretics given his soft blood pressure.  Will continue Lasix , Aldactone  and midodrine .  This will be adjusted outpatient as necessary. -Patient will need endoscopic evaluation and likely prophylactic banding for esophageal varices but I will defer this to [facility].  -Currently no evidence of encephalopathy  Hypokalemia -As needed repletion. - Mag and Phos normal  Alcohol abuse - Alcohol withdrawal protocol.  Multivitamin, folic acid  and thiamine   DVT prophylaxis:Lovenox     Code Status: Full Code Continue hospital stay for management of decompensated liver cirrhosis Discharge today   Subjective: Feeling okay no complaints Examination:  General exam: Appears calm and comfortable  Respiratory system: Clear to auscultation. Respiratory effort normal. Cardiovascular system: S1 & S2 heard, RRR. No JVD, murmurs, rubs, gallops or clicks. No pedal edema. Gastrointestinal system: Distended abdomen with positive fluid wave shift.  It is still not as tense yet Central nervous system: Alert and oriented. No focal neurological deficits. Extremities: Symmetric 5 x 5 power. Skin: No rashes, lesions or ulcers Psychiatry: Judgement and insight appear normal. Mood & affect appropriate.    Discharge Diagnoses:  Principal Problem:   Liver cirrhosis (HCC) Active Problems:   Cirrhosis of liver [facility])      Discharge Exam: Vitals:   12/04/23 2307 12/05/23 0701  BP: (!) 93/51 (!) 94/54  Pulse: (!) 59 69  Resp: 14 16  Temp: 98.5 F (36.9 C) 98.4 F (36.9 C)  SpO2: 98% 96%   Vitals:   12/04/23 1145 12/04/23 1151 12/04/23 2307 12/05/23 0701  BP: 103/68 104/65 (!) 93/51 (!) 94/54  Pulse:   (!) 59 69  Resp:   14 16  Temp:   98.5 F (36.9 C) 98.4 F (36.9 C)  TempSrc:   Oral Oral  SpO2:  98% 96%  Weight:      Height:          Discharge Instructions  Discharge Instructions     meds to beds pharmacy consult ([facility] ONLY)   Complete by: As  directed       Allergies as of 12/05/2023   No Known Allergies      Medication List     TAKE these medications    ANTACID FLAVOR CHEWS PO Take 2 tablets by mouth daily as needed.   folic acid  1 MG tablet Commonly known as: FOLVITE  Take 1 tablet (1 mg total) by mouth daily.   furosemide  40 MG tablet Commonly known as: LASIX  Take 1 tablet (40 mg total) by mouth daily. Start taking on: December 06, 2023   [facility] Vitamin B-1 100 MG tablet Generic drug: thiamine  Take 100 mg by mouth daily.   midodrine  5 MG tablet Commonly known as: PROAMATINE  Take 3 tablets (15 mg total) by mouth 3 (three) times daily with meals.   multivitamin with minerals Tabs tablet Take 1 tablet by mouth daily.   pantoprazole  40 MG tablet Commonly known as: PROTONIX  Take 1 tablet (40 mg total) by mouth daily before breakfast. What changed: when to take this   spironolactone  100 MG tablet Commonly known as: ALDACTONE  Take 1 tablet (100 mg total) by mouth daily. Start taking on: December 06, 2023   thiamine  100 MG tablet Commonly known as: VITAMIN B1 Take 1 tablet (100 mg total) by mouth daily.   traMADol 50 MG tablet Commonly known as: ULTRAM Take 50 mg by mouth every 8 (eight) hours as needed for moderate pain (pain score 4-6) or severe pain (pain score 7-10).        Follow-up Information     Stamey, Chiquita POUR, FNP Follow up in 1 week(s).   Specialty: Family Medicine Contact information: [address] [place] [address] [phone]         Elicia Claw, MD. Schedule an appointment as soon as possible for a visit in 3 day(s).   Specialty: Gastroenterology Contact information: [address] [place] [address] [phone]                No Known Allergies  You were cared for by a hospitalist during your hospital stay. If you have any questions about your discharge medications or the care you received while you were in the hospital after  you are discharged, you can call the unit and asked to speak with the hospitalist on call if the hospitalist that took care of you is not available. Once you are discharged, your primary care physician will handle any further medical issues. Please note that no refills for any discharge medications will be authorized once you are discharged, as it is imperative that you return to your primary care physician (or establish a relationship with a primary care physician if you do not have one) for your aftercare needs so that they can reassess your need for medications and monitor your lab values.  You were cared for by a hospitalist during your hospital stay. If you have any questions about your discharge medications or the care you received while you were in the hospital after you are discharged, you can call the unit and asked to speak with the hospitalist on call if the hospitalist that took care of you is not available. Once you are discharged, your primary care physician will handle any further medical issues. Please  note that NO REFILLS for any discharge medications will be authorized once you are discharged, as it is imperative that you return to your primary care physician (or establish a relationship with a primary care physician if you do not have one) for your aftercare needs so that they can reassess your need for medications and monitor your lab values.  Please request your Prim.MD to go over all Hospital Tests and Procedure/Radiological results at the follow up, please get all Hospital records sent to your Prim MD by signing hospital release before you go home.  Get CBC, CMP, 2 view Chest X ray checked  by Primary MD during your next visit or SNF MD in 5-7 days ( we routinely change or add medications that can affect your baseline labs and fluid status, therefore we recommend that you get the mentioned basic workup next visit with your PCP, your PCP may decide not to get them or add new tests based  on their clinical decision)  On your next visit with your primary care physician please Get Medicines reviewed and adjusted.  If you experience worsening of your admission symptoms, develop shortness of breath, life threatening emergency, suicidal or homicidal thoughts you must seek medical attention immediately by calling 911 or calling your MD immediately  if symptoms less severe.  You Must read complete instructions/literature along with all the possible adverse reactions/side effects for all the Medicines you take and that have been prescribed to you. Take any new Medicines after you have completely understood and accpet all the possible adverse reactions/side effects.   Do not drive, operate heavy machinery, perform activities at heights, swimming or participation in water activities or provide baby sitting services if your were admitted for syncope or siezures until you have seen by Primary MD or a Neurologist and advised to do so again.  Do not drive when taking Pain medications.   Procedures/Studies: US  Paracentesis Result Date: 12/04/2023 INDICATION: 28 year old male with alcohol cirrhosis, recurrent ascites. Request for diagnostic and therapeutic paracentesis. EXAM: ULTRASOUND GUIDED DIAGNOSTIC AND THERAPEUTIC PARACENTESIS MEDICATIONS: 10 mL 1% lidocaine  COMPLICATIONS: None immediate. PROCEDURE: Informed written consent was obtained from the patient after a discussion of the risks, benefits and alternatives to treatment. A timeout was performed prior to the initiation of the procedure. Initial ultrasound scanning demonstrates a large amount of ascites within the right lower abdominal quadrant. The right lower abdomen was prepped and draped in the usual sterile fashion. 1% lidocaine  was used for local anesthesia. Following this, a 19 gauge, 7-cm, Yueh catheter was introduced. An ultrasound image was saved for documentation purposes. The paracentesis was performed. The catheter was removed and  a dressing was applied. The patient tolerated the procedure well without immediate post procedural complication. FINDINGS: A total of approximately 4.8 liters of yellow fluid was removed. Samples were sent to the laboratory as requested by the clinical team. IMPRESSION: Successful ultrasound-guided paracentesis yielding 4.8 liters of peritoneal fluid. Performed by: Kacie Matthews PA-C Electronically Signed   By: Wilkie Lent M.D.   On: 12/04/2023 12:43   US  Paracentesis Result Date: 11/30/2023 INDICATION: 28 year old male with recent diagnosis of alcoholic cirrhosis presents with abdominal distension, previous imaging showed ascites. Request for therapeutic and diagnostic paracentesis. EXAM: ULTRASOUND GUIDED  PARACENTESIS MEDICATIONS: 8 mL 1% lidocaine  COMPLICATIONS: None immediate. PROCEDURE: Informed written consent was obtained from the patient after a discussion of the risks, benefits and alternatives to treatment. A timeout was performed prior to the initiation of the procedure. Initial ultrasound  scanning demonstrates a large amount of ascites within the right lower abdominal quadrant. The right lower abdomen was prepped and draped in the usual sterile fashion. 1% lidocaine  was used for local anesthesia. Following this, a 19 gauge, 7-cm, Yueh catheter was introduced. An ultrasound image was saved for documentation purposes. The paracentesis was performed. The catheter was removed and a dressing was applied. The patient tolerated the procedure well without immediate post procedural complication. Per EMR, patient is refusing blood products. FINDINGS: A total of approximately 7.3 L of hazy yellow fluid was removed. Samples were sent to the laboratory as requested by the clinical team. IMPRESSION: Successful ultrasound-guided paracentesis yielding 7.3 liters of peritoneal fluid. PLAN: If the patient eventually requires >/=2 paracenteses in a 30 day period, candidacy for formal evaluation by the [facility]  Interventional Radiology [facility] will be assessed. Performed by: Aimee Han, PA-C Electronically Signed   By: Juliene Balder M.D.   On: 11/30/2023 17:02   DG Chest Portable 1 View Result Date: 11/30/2023 CLINICAL DATA:  right anterior lower rib pain s/p fall, eval for fx EXAM: PORTABLE CHEST - 1 VIEW COMPARISON:  09/10/2023 FINDINGS: New small to moderate right pleural effusion, with new atelectasis/consolidation at the right lung base. Left lung remains clear except for a subcentimeter calcified granuloma peripherally in the mid lung field. No pneumothorax. Heart size and mediastinal contours are within normal limits. Visualized bones unremarkable. IMPRESSION: New small to moderate right pleural effusion with right basilar atelectasis/consolidation. Electronically Signed   By: JONETTA Faes M.D.   On: 11/30/2023 08:12   CT ABDOMEN PELVIS W CONTRAST Result Date: 11/30/2023 CLINICAL DATA:  Acute, nonlocalized abdominal pain EXAM: CT ABDOMEN AND PELVIS WITH CONTRAST TECHNIQUE: Multidetector CT imaging of the abdomen and pelvis was performed using the standard protocol following bolus administration of intravenous contrast. RADIATION DOSE REDUCTION: This exam was performed according to the departmental dose-optimization program which includes automated exposure control, adjustment of the mA and/or kV according to patient size and/or use of iterative reconstruction technique. CONTRAST:  OMNIPAQUE  IOHEXOL  300 MG/ML  SOLN COMPARISON:  09/09/2023 FINDINGS: Lower chest: Small pleural effusions with atelectasis at the right base. Extensive esophageal varices with periesophageal and subserosal enhancement similar to priors. There may be LAD atheromatous calcification. Hepatobiliary: Known cirrhosis without visible mass. The umbilical vein has recanalized. Ascites is large volume with abdominal distension.No evidence of biliary obstruction or stone. Pancreas: Unremarkable. Spleen: Unremarkable.  Adrenals/Urinary Tract: Negative adrenals. No hydronephrosis or stone. Unremarkable bladder. Stomach/Bowel:  No obstruction. No appendicitis. Vascular/Lymphatic: Portal hypertension as noted above. 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 No mass or adenopathy. Reproductive:No pathologic findings. Other: No ascites or pneumoperitoneum. Musculoskeletal: No acute abnormalities. IMPRESSION: 1. Cirrhosis large volume ascites distending the abdomen. 2. Small pleural effusions with multi segment atelectasis in the right lower lobe Electronically Signed   By: Dorn Roulette M.D.   On: 11/30/2023 04:09   DG Knee Complete 4 Views Left Result Date: 11/30/2023 CLINICAL DATA:  Fall EXAM: LEFT KNEE - COMPLETE 4+ VIEW COMPARISON:  None Available. FINDINGS: No evidence of fracture, dislocation, or joint effusion. No evidence of arthropathy or other focal bone abnormality. Soft tissues are unremarkable. IMPRESSION: Negative. Electronically Signed   By: Franky Stanford M.D.   On: 11/30/2023 00:48     The results of significant diagnostics from this hospitalization (including imaging, microbiology, ancillary and laboratory) are listed below for reference.     Microbiology: Recent Results (from the  past 240 hours)  Culture, body fluid w Gram Stain-bottle     Status: None   Collection Time: 11/30/23 11:20 AM   Specimen: Path fluid  Result Value Ref Range Status   Specimen Description FLUID  Final   Special Requests NONE  Final   Culture   Final    NO GROWTH 5 DAYS Performed at [facility], [address] [place], [place] [address]    Report Status 12/05/2023 FINAL  Final  Gram stain     Status: None   Collection Time: 11/30/23 11:20 AM   Specimen: Path fluid  Result Value Ref Range Status   Specimen Description FLUID  Final   Special Requests NONE  Final   Gram Stain   Final    NO WBC SEEN NO ORGANISMS SEEN Performed at [facility], [address] [place], [place] [address]    Report Status 12/02/2023 FINAL  Final  Body fluid culture w Gram Stain     Status: None (Preliminary result)   Collection Time: 12/04/23 11:23 AM   Specimen: PATH Cytology Peritoneal fluid  Result Value Ref Range Status   Specimen Description   Final    PERITONEAL Performed at [facility], [address], [place], [place] [address]    Special Requests   Final    NONE Performed at [facility], [address], [place], [place] [address]    Gram Stain   Final    RARE WBC PRESENT,BOTH PMN AND MONONUCLEAR NO ORGANISMS SEEN    Culture   Final    NO GROWTH < 24 HOURS Performed at [facility], [address] [place], [place] [address]    Report Status PENDING  Incomplete     Labs: BNP (last 3 results) No results for input(s): BNP in the last 8760 hours. Basic Metabolic Panel: Recent Labs  Lab 12/01/23 0339 12/02/23 0320 12/03/23 0341 12/04/23 0336 12/05/23 0347  NA 132* 136 137 136 133*  K 3.3* 3.3* 3.8 3.2* 4.0  CL 99 105 104 102 102  CO2 27 23 25 26 22   GLUCOSE 68* 95 99 88 71  BUN 7 8 7 10 9   CREATININE 0.69 0.46* 0.74 0.69 0.73  CALCIUM 7.4* 7.9* 8.2* 8.0* 8.2*  MG 2.0 2.1 2.0 2.1 2.0  PHOS 2.5  --   --   --   --    Liver Function Tests: Recent Labs  Lab 12/01/23 0339 12/02/23 0320 12/03/23 0341 12/04/23 0336 12/05/23 0347  AST 31 26 26 31  37  ALT 16 13 12 14 17   ALKPHOS 90 79 68 81 88  BILITOT 2.9* 3.0* 2.3* 2.0* 1.9*  PROT 4.9* 5.0* 4.8* 5.5* 5.6*  ALBUMIN  1.5* 2.5* 2.5* 2.6* 2.6*   No results for input(s): LIPASE, AMYLASE in the last 168 hours. Recent Labs  Lab 11/30/23 0800  AMMONIA 51*   CBC: Recent Labs  Lab 12/01/23 0339 12/02/23 0320 12/03/23 0341 12/04/23 0336 12/05/23 0347  WBC 7.9 5.7 7.2 6.0 7.4  HGB 11.2* 10.2* 10.1* 11.0* 12.0*  HCT 34.0* 30.5* 31.4* 33.7* 37.0*  MCV 97.7 95.9 99.7 99.1 98.7  PLT 152 128* 130* 148* 159    Cardiac Enzymes: No results for input(s): CKTOTAL, CKMB, CKMBINDEX, TROPONINI in the last 168 hours. BNP: Invalid input(s): POCBNP CBG: No results for input(s): GLUCAP in the last 168 hours. D-Dimer No results for input(s): DDIMER in the last 72 hours. Hgb A1c No results  for input(s): HGBA1C in the last 72 hours. Lipid Profile No results for input(s): CHOL, HDL, LDLCALC, TRIG, CHOLHDL, LDLDIRECT in the last 72 hours. Thyroid function studies No results for input(s): TSH, T4TOTAL, T3FREE, THYROIDAB in the last 72 hours.  Invalid input(s): FREET3 Anemia work up No results for input(s): VITAMINB12, FOLATE, FERRITIN, TIBC, IRON, RETICCTPCT in the last 72 hours. Urinalysis    Component Value Date/Time   COLORURINE AMBER (A) 09/10/2023 0759   APPEARANCEUR HAZY (A) 09/10/2023 0759   LABSPEC >1.046 (H) 09/10/2023 0759   PHURINE 6.0 09/10/2023 0759   GLUCOSEU NEGATIVE 09/10/2023 0759   HGBUR NEGATIVE 09/10/2023 0759   BILIRUBINUR MODERATE (A) 09/10/2023 0759   KETONESUR 5 (A) 09/10/2023 0759   PROTEINUR NEGATIVE 09/10/2023 0759   NITRITE NEGATIVE 09/10/2023 0759   LEUKOCYTESUR NEGATIVE 09/10/2023 0759   Sepsis Labs Recent Labs  Lab 12/02/23 0320 12/03/23 0341 12/04/23 0336 12/05/23 0347  WBC 5.7 7.2 6.0 7.4   Microbiology Recent Results (from the past 240 hours)  Culture, body fluid w Gram Stain-bottle     Status: None   Collection Time: 11/30/23 11:20 AM   Specimen: Path fluid  Result Value Ref Range Status   Specimen Description FLUID  Final   Special Requests NONE  Final   Culture   Final    NO GROWTH 5 DAYS Performed at [facility], [address] [place], [place] [address]    Report Status 12/05/2023 FINAL  Final  Gram stain     Status: None   Collection Time: 11/30/23 11:20 AM   Specimen: Path fluid  Result Value Ref Range Status   Specimen Description FLUID  Final   Special Requests NONE  Final    Gram Stain   Final    NO WBC SEEN NO ORGANISMS SEEN Performed at [facility], [address], [place], [place] [address]    Report Status 12/02/2023 FINAL  Final  Body fluid culture w Gram Stain     Status: None (Preliminary result)   Collection Time: 12/04/23 11:23 AM   Specimen: PATH Cytology Peritoneal fluid  Result Value Ref Range Status   Specimen Description   Final    PERITONEAL Performed at [facility], [address], [place], [place] [address]    Special Requests   Final    NONE Performed at [facility], [address] [place], [place] [address]    Gram Stain   Final    RARE WBC PRESENT,BOTH PMN AND MONONUCLEAR NO ORGANISMS SEEN    Culture   Final    NO GROWTH < 24 HOURS Performed at [facility], [address], [place], [place] [address]    Report Status PENDING  Incomplete     Time coordinating discharge:  I have spent 35 minutes face to face with the patient and on the ward discussing the patients care, assessment, plan and disposition with other care givers. >50% of the time was devoted counseling the patient about the risks and benefits of treatment/Discharge disposition and coordinating care.   SIGNED:   Burgess JAYSON Dare, MD  [facility] 12/05/2023, 12:29 PM   If 7PM-7AM, please contact night-coverage

## 2023-12-05 NOTE — Plan of Care (Signed)
   Problem: Coping: Goal: Level of anxiety will decrease Outcome: Progressing   Problem: Pain Managment: Goal: General experience of comfort will improve and/or be controlled Outcome: Progressing

## 2023-12-06 LAB — CYTOLOGY - NON PAP

## 2023-12-07 LAB — BODY FLUID CULTURE W GRAM STAIN: Culture: NO GROWTH

## 2023-12-10 LAB — LIPASE, FLUID: Lipase-Fluid: 111 U/L

## 2023-12-13 DIAGNOSIS — K7031 Alcoholic cirrhosis of liver with ascites: Secondary | ICD-10-CM | POA: Diagnosis not present

## 2023-12-13 DIAGNOSIS — R945 Abnormal results of liver function studies: Secondary | ICD-10-CM | POA: Diagnosis not present

## 2023-12-13 DIAGNOSIS — M25562 Pain in left knee: Secondary | ICD-10-CM | POA: Diagnosis not present

## 2023-12-13 DIAGNOSIS — R748 Abnormal levels of other serum enzymes: Secondary | ICD-10-CM | POA: Diagnosis not present

## 2023-12-13 DIAGNOSIS — R0781 Pleurodynia: Secondary | ICD-10-CM | POA: Diagnosis not present

## 2023-12-23 DIAGNOSIS — K7031 Alcoholic cirrhosis of liver with ascites: Secondary | ICD-10-CM | POA: Diagnosis not present

## 2023-12-23 DIAGNOSIS — I8510 Secondary esophageal varices without bleeding: Secondary | ICD-10-CM | POA: Diagnosis not present

## 2023-12-30 ENCOUNTER — Other Ambulatory Visit (HOSPITAL_COMMUNITY): Payer: Self-pay

## 2024-04-21 DIAGNOSIS — M25562 Pain in left knee: Secondary | ICD-10-CM | POA: Diagnosis not present

## 2024-05-08 DIAGNOSIS — G894 Chronic pain syndrome: Secondary | ICD-10-CM | POA: Diagnosis not present

## 2024-05-08 DIAGNOSIS — N62 Hypertrophy of breast: Secondary | ICD-10-CM | POA: Diagnosis not present

## 2024-05-08 DIAGNOSIS — Z681 Body mass index (BMI) 19 or less, adult: Secondary | ICD-10-CM | POA: Diagnosis not present

## 2024-05-14 DIAGNOSIS — M25562 Pain in left knee: Secondary | ICD-10-CM | POA: Diagnosis not present

## 2024-06-22 DIAGNOSIS — M25562 Pain in left knee: Secondary | ICD-10-CM | POA: Diagnosis not present

## 2024-07-30 DIAGNOSIS — Z23 Encounter for immunization: Secondary | ICD-10-CM | POA: Diagnosis not present

## 2024-07-30 DIAGNOSIS — F17210 Nicotine dependence, cigarettes, uncomplicated: Secondary | ICD-10-CM | POA: Diagnosis not present

## 2024-07-30 DIAGNOSIS — B159 Hepatitis A without hepatic coma: Secondary | ICD-10-CM | POA: Diagnosis not present

## 2024-07-30 DIAGNOSIS — N62 Hypertrophy of breast: Secondary | ICD-10-CM | POA: Diagnosis not present

## 2024-07-30 DIAGNOSIS — F1011 Alcohol abuse, in remission: Secondary | ICD-10-CM | POA: Diagnosis not present

## 2024-07-30 DIAGNOSIS — K769 Liver disease, unspecified: Secondary | ICD-10-CM | POA: Diagnosis not present

## 2024-07-30 DIAGNOSIS — K7011 Alcoholic hepatitis with ascites: Secondary | ICD-10-CM | POA: Diagnosis not present

## 2024-07-30 DIAGNOSIS — K7031 Alcoholic cirrhosis of liver with ascites: Secondary | ICD-10-CM | POA: Diagnosis not present

## 2024-07-30 DIAGNOSIS — I959 Hypotension, unspecified: Secondary | ICD-10-CM | POA: Diagnosis not present
